# Patient Record
Sex: Male | Born: 1985 | Race: White | Hispanic: Yes | Marital: Single | Smoking: Never smoker
Health system: Southern US, Community
[De-identification: ages and names within clinical notes are randomized; demographics above are authoritative.]

## PROBLEM LIST (undated history)

## (undated) DIAGNOSIS — E079 Disorder of thyroid, unspecified: Secondary | ICD-10-CM

## (undated) DIAGNOSIS — I10 Essential (primary) hypertension: Secondary | ICD-10-CM

## (undated) HISTORY — DX: Essential (primary) hypertension: I10

## (undated) HISTORY — DX: Disorder of thyroid, unspecified: E07.9

## (undated) HISTORY — PX: SHOULDER SURGERY: SHX246

---

## 2009-09-24 ENCOUNTER — Emergency Department (HOSPITAL_COMMUNITY): Admission: EM | Admit: 2009-09-24 | Discharge: 2009-09-24 | Payer: Self-pay | Admitting: Emergency Medicine

## 2010-04-11 ENCOUNTER — Emergency Department (HOSPITAL_COMMUNITY)
Admission: EM | Admit: 2010-04-11 | Discharge: 2010-04-11 | Disposition: A | Discharge status: Home / Self Care | Payer: Self-pay | Attending: Emergency Medicine | Admitting: Emergency Medicine

## 2010-04-11 ENCOUNTER — Emergency Department (HOSPITAL_COMMUNITY): Payer: Self-pay

## 2010-04-11 DIAGNOSIS — W268XXA Contact with other sharp object(s), not elsewhere classified, initial encounter: Secondary | ICD-10-CM | POA: Insufficient documentation

## 2010-04-11 DIAGNOSIS — S91309A Unspecified open wound, unspecified foot, initial encounter: Secondary | ICD-10-CM | POA: Insufficient documentation

## 2010-04-11 DIAGNOSIS — M79609 Pain in unspecified limb: Secondary | ICD-10-CM | POA: Insufficient documentation

## 2010-04-11 DIAGNOSIS — Z1881 Retained glass fragments: Secondary | ICD-10-CM | POA: Insufficient documentation

## 2010-05-24 ENCOUNTER — Emergency Department (HOSPITAL_COMMUNITY): Payer: Self-pay

## 2010-05-24 ENCOUNTER — Emergency Department (HOSPITAL_COMMUNITY)
Admission: EM | Admit: 2010-05-24 | Discharge: 2010-05-24 | Disposition: A | Discharge status: Home / Self Care | Payer: Self-pay | Attending: Emergency Medicine | Admitting: Emergency Medicine

## 2010-05-24 DIAGNOSIS — M25529 Pain in unspecified elbow: Secondary | ICD-10-CM | POA: Insufficient documentation

## 2010-05-24 DIAGNOSIS — M545 Low back pain, unspecified: Secondary | ICD-10-CM | POA: Insufficient documentation

## 2010-05-24 DIAGNOSIS — M79609 Pain in unspecified limb: Secondary | ICD-10-CM | POA: Insufficient documentation

## 2010-05-24 DIAGNOSIS — S7000XA Contusion of unspecified hip, initial encounter: Secondary | ICD-10-CM | POA: Insufficient documentation

## 2010-05-24 DIAGNOSIS — W138XXA Fall from, out of or through other building or structure, initial encounter: Secondary | ICD-10-CM | POA: Insufficient documentation

## 2010-05-24 DIAGNOSIS — R109 Unspecified abdominal pain: Secondary | ICD-10-CM | POA: Insufficient documentation

## 2010-05-24 DIAGNOSIS — S62109A Fracture of unspecified carpal bone, unspecified wrist, initial encounter for closed fracture: Secondary | ICD-10-CM | POA: Insufficient documentation

## 2010-05-24 DIAGNOSIS — M25559 Pain in unspecified hip: Secondary | ICD-10-CM | POA: Insufficient documentation

## 2010-05-24 LAB — CBC
HCT: 47.8 % (ref 39.0–52.0)
Hemoglobin: 16.8 g/dL (ref 13.0–17.0)
MCH: 30.9 pg (ref 26.0–34.0)
MCHC: 35.1 g/dL (ref 30.0–36.0)
MCV: 87.9 fL (ref 78.0–100.0)
Platelets: 318 K/uL (ref 150–400)
RBC: 5.44 MIL/uL (ref 4.22–5.81)
RDW: 12.5 % (ref 11.5–15.5)
WBC: 7.8 10*3/uL (ref 4.0–10.5)

## 2010-05-24 LAB — BASIC METABOLIC PANEL WITH GFR
CO2: 25 meq/L (ref 19–32)
Calcium: 9.8 mg/dL (ref 8.4–10.5)
Creatinine, Ser: 0.98 mg/dL (ref 0.4–1.5)
GFR calc Af Amer: >60 mL/min (ref >60)
Sodium: 145 meq/L (ref 135–145)

## 2010-05-24 LAB — DIFFERENTIAL
Basophils Absolute: 0 K/uL (ref 0.0–0.1)
Basophils Relative: 0 % (ref 0–1)
Eosinophils Absolute: 0.1 K/uL (ref 0.0–0.7)
Eosinophils Relative: 1 % (ref 0–5)
Lymphocytes Relative: 20 % (ref 12–46)
Lymphs Abs: 1.6 10*3/uL (ref 0.7–4.0)
Monocytes Absolute: 0.5 10*3/uL (ref 0.1–1.0)
Monocytes Relative: 7 % (ref 3–12)
Neutro Abs: 5.6 K/uL (ref 1.7–7.7)
Neutrophils Relative %: 72 % (ref 43–77)

## 2010-05-24 LAB — BASIC METABOLIC PANEL
BUN: 21 mg/dL (ref 6–23)
Chloride: 110 mEq/L (ref 96–112)
GFR calc non Af Amer: >60 mL/min (ref >60)
Glucose, Bld: 115 mg/dL — ABNORMAL HIGH (ref 70–99)
Potassium: 4 mEq/L (ref 3.5–5.1)

## 2010-05-24 LAB — URINALYSIS, ROUTINE W REFLEX MICROSCOPIC
Bilirubin Urine: NEGATIVE
Glucose, UA: NEGATIVE mg/dL
Hgb urine dipstick: NEGATIVE
Ketones, ur: NEGATIVE mg/dL
Nitrite: NEGATIVE
Protein, ur: NEGATIVE mg/dL
Specific Gravity, Urine: 1.039 — ABNORMAL HIGH (ref 1.005–1.030)
Urobilinogen, UA: 0.2 mg/dL (ref 0.0–1.0)
pH: 5.5 (ref 5.0–8.0)

## 2010-05-24 LAB — SAMPLE TO BLOOD BANK

## 2010-05-24 MED ORDER — IOHEXOL 300 MG/ML  SOLN
100.0000 mL | Freq: Once | INTRAMUSCULAR | Status: AC | PRN
  Administered 2010-05-24: 100 mL via INTRAVENOUS

## 2010-12-02 ENCOUNTER — Emergency Department (HOSPITAL_COMMUNITY)
Admission: EM | Admit: 2010-12-02 | Discharge: 2010-12-02 | Disposition: A | Discharge status: Home / Self Care | Payer: Self-pay | Attending: Emergency Medicine | Admitting: Emergency Medicine

## 2010-12-02 ENCOUNTER — Emergency Department (HOSPITAL_COMMUNITY): Payer: Self-pay

## 2010-12-02 DIAGNOSIS — B2790 Infectious mononucleosis, unspecified without complication: Secondary | ICD-10-CM | POA: Insufficient documentation

## 2010-12-02 DIAGNOSIS — R070 Pain in throat: Secondary | ICD-10-CM | POA: Insufficient documentation

## 2010-12-02 DIAGNOSIS — R1310 Dysphagia, unspecified: Secondary | ICD-10-CM | POA: Insufficient documentation

## 2010-12-02 DIAGNOSIS — R599 Enlarged lymph nodes, unspecified: Secondary | ICD-10-CM | POA: Insufficient documentation

## 2010-12-02 LAB — CBC
HCT: 44 % (ref 39.0–52.0)
Hemoglobin: 16 g/dL (ref 13.0–17.0)
MCV: 87.5 fL (ref 78.0–100.0)
RBC: 5.03 MIL/uL (ref 4.22–5.81)
WBC: 6 10*3/uL (ref 4.0–10.5)

## 2010-12-02 MED ORDER — IOHEXOL 300 MG/ML  SOLN: 75.0000 mL | Freq: Once | INTRAMUSCULAR | Status: DC | PRN

## 2015-12-15 ENCOUNTER — Emergency Department (HOSPITAL_COMMUNITY)
Admission: EM | Admit: 2015-12-15 | Discharge: 2015-12-15 | Disposition: A | Discharge status: Home / Self Care | Payer: Self-pay | Attending: Emergency Medicine | Admitting: Emergency Medicine

## 2015-12-15 ENCOUNTER — Encounter (HOSPITAL_COMMUNITY): Payer: Self-pay

## 2015-12-15 ENCOUNTER — Emergency Department (HOSPITAL_COMMUNITY): Payer: Self-pay

## 2015-12-15 DIAGNOSIS — Y929 Unspecified place or not applicable: Secondary | ICD-10-CM | POA: Insufficient documentation

## 2015-12-15 DIAGNOSIS — S20221A Contusion of right back wall of thorax, initial encounter: Secondary | ICD-10-CM

## 2015-12-15 DIAGNOSIS — S300XXA Contusion of lower back and pelvis, initial encounter: Secondary | ICD-10-CM | POA: Insufficient documentation

## 2015-12-15 DIAGNOSIS — Y939 Activity, unspecified: Secondary | ICD-10-CM | POA: Insufficient documentation

## 2015-12-15 DIAGNOSIS — W501XXA Accidental kick by another person, initial encounter: Secondary | ICD-10-CM | POA: Insufficient documentation

## 2015-12-15 DIAGNOSIS — Y999 Unspecified external cause status: Secondary | ICD-10-CM | POA: Insufficient documentation

## 2015-12-15 MED ORDER — LIDOCAINE 5 % EX PTCH: 1.0000 | MEDICATED_PATCH | CUTANEOUS | 0 refills | Status: DC

## 2015-12-15 MED ORDER — MELOXICAM 7.5 MG PO TABS: 7.5000 mg | ORAL_TABLET | Freq: Every day | ORAL | 0 refills | Status: DC

## 2015-12-15 MED ORDER — CYCLOBENZAPRINE HCL 10 MG PO TABS: 10.0000 mg | ORAL_TABLET | Freq: Three times a day (TID) | ORAL | 0 refills | Status: DC | PRN
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
## 2015-12-15 NOTE — ED Notes (Signed)
Bed: WTR8 Expected date:  Expected time:  Means of arrival:  Comments: 

## 2015-12-15 NOTE — ED Notes (Signed)
Pt offered wheelchair to lobby until room available pt denies wheelchair.
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
## 2015-12-15 NOTE — Discharge Instructions (Signed)
Read the information below.  Use the prescribed medication as directed.  Please discuss all new medications with your pharmacist.  You may return to the Emergency Department at any time for worsening condition or any new symptoms that concern you.     If you develop fevers, loss of control of bowel or bladder, weakness or numbness in your legs, or are unable to walk, return to the ER for a recheck.   Lea la informacin a continuacin. Use el medicamento recetado como se le indic. Por favor, discuta todos los medicamentos nuevos con su farmacutico. Puede volver al MicrosoftDepartamento de Emergencias en cualquier momento por empeoramiento de la condicin o cualquier sntoma nuevo que le concierna. Si desarrolla fiebres, prdida de control de los intestinos o la vejiga, debilidad o entumecimiento en las piernas, o no puede caminar, regrese a la sala de emergencias para una nueva verificacin.

## 2016-09-03 ENCOUNTER — Ambulatory Visit (INDEPENDENT_AMBULATORY_CARE_PROVIDER_SITE_OTHER): Payer: Self-pay

## 2016-09-03 ENCOUNTER — Encounter: Payer: Self-pay | Admitting: Urgent Care

## 2016-09-03 ENCOUNTER — Ambulatory Visit (INDEPENDENT_AMBULATORY_CARE_PROVIDER_SITE_OTHER): Payer: Self-pay | Admitting: Urgent Care

## 2016-09-03 VITALS — BP 122/72 | HR 73 | Temp 98.0°F | Resp 17 | Ht 66.5 in | Wt 205.0 lb

## 2016-09-03 DIAGNOSIS — M25561 Pain in right knee: Secondary | ICD-10-CM

## 2016-09-03 MED ORDER — MELOXICAM 15 MG PO TABS: 7.5000 mg | ORAL_TABLET | Freq: Every day | ORAL | 0 refills | Status: DC

## 2016-09-03 NOTE — Patient Instructions (Addendum)
Dolor de rodilla (Knee Pain) El dolor de rodilla es un sntoma muy comn y puede tener muchas causas. Suele desaparecer cuando se siguen las indicaciones del mdico en lo que respecta a Engineer, materialsaliviar el dolor y las molestias en la casa. Sin embargo, puede Scientist, product/process developmentprogresar hasta convertirse en una afeccin que requiere Obiontratamiento. Algunas afecciones pueden incluir lo siguiente:  Artritis por uso y Chiropractordesgaste (artrosis).  Artritis por hinchazn e irritacin (artritis reumatoide o gota).  Un quiste o un crecimiento en la rodilla.  Una infeccin en la articulacin de la rodilla.  Una lesin que no se Arubacura.  Dao, hinchazn o irritacin de los tejidos que sostienen la rodilla (distensin de ligamentos o tendinitis). Si el dolor de Paramedicrodilla persiste, tal vez haya que realizar ms estudios para Scientist, forensicdiagnosticar la afeccin, los cuales pueden incluir radiografas u otros estudios de diagnstico por imgenes de la rodilla. Adems, es posible que haya que extraerle lquido de la rodilla. El tratamiento del dolor continuo de rodilla depende de la causa, pero puede incluir lo siguiente:  Medicamentos para Engineer, materialsaliviar el dolor o reducir la hinchazn.  Inyecciones de corticoides en la rodilla.  Fisioterapia.  Ciruga. INSTRUCCIONES PARA EL CUIDADO EN EL HOGAR  Tome los medicamentos solamente como se lo haya indicado el mdico.  Mantenga la rodilla en reposo y en alto (elevada) mientras est descansando.  No haga cosas que le causen dolor o que lo intensifiquen.  Evite las actividades o los ejercicios de alto impacto, como correr, Public relations account executivesaltar la soga o hacer saltos de tijera.  Aplique hielo sobre la zona de la rodilla: ? Ponga el hielo en una bolsa plstica. ? Coloque una FirstEnergy Corptoalla entre la piel y la bolsa de hielo. ? Coloque el hielo durante 20 minutos, 2 a 3 veces por da.  Pregntele al mdico si debe usar una Neurosurgeonrodillera elstica.  Cuando duerma, pngase una almohada debajo de la rodilla.  Baje de peso si es  necesario. El Union Parkpeso extra puede generar presin en la rodilla.  No consuma ningn producto que contenga tabaco, lo que incluye cigarrillos, tabaco de Theatre managermascar o Administrator, Civil Servicecigarrillos electrnicos. Si necesita ayuda para dejar de fumar, consulte al mdico. Fumar puede retrasar la curacin de cualquier problema que tenga en el hueso y Nurse, learning disabilityla articulacin. SOLICITE ATENCIN MDICA SI:  El dolor de rodilla contina, [place] Pol[place] o empeora.  Tiene fiebre junto con dolor de rodilla.  La rodilla se le tuerce o se le traba.  La rodilla est ms hinchada. SOLICITE ATENCIN MDICA DE INMEDIATO SI:   La articulacin de la rodilla est caliente al tacto.  Tiene dolor en el pecho o dificultad para respirar. Esta informacin no tiene Theme park managercomo fin reemplazar el consejo del mdico. Asegrese de hacerle al mdico cualquier pregunta que tenga. Document Released: 07/31/2007 Document Revised: 03/04/2014 Elsevier Interactive Patient Education  2017 [employer]vier [employer].     IF you received an x-ray today, you will receive an invoice from [facility]. Please contact [facility] at [phone] with questions or concerns regarding your invoice.   IF you received labwork today, you will receive an invoice from [facility]. Please contact [facility] at [phone] with questions or concerns regarding your invoice.   Our billing staff will not be able to assist you with questions regarding bills from these companies.  You will be contacted with the lab results as soon as they are available. The fastest way to get your results is to activate your My Chart account. Instructions are located on the last page of this paperwork. If  you have not heard from [place] regarding the results in 2 weeks, please contact this office.

## 2016-09-03 NOTE — Progress Notes (Addendum)
  MRN: [id number] DOB: Apr 06, 1985  Subjective:   Victor Gonzalez is a 31 y.o. male presenting for chief complaint of Knee Pain (right )  Reports 4 day history of worsening right knee pain. Pain is sharp in nature, worse with pressure and bending his knee, has associated swelling. Patient works in Quarry managerroofing, uses Biomedical engineerknee pads. Denies fever, redness, warmth, knee buckling, weakness, trauma, falls. Uses otc supplement for joints.   Victor Gonzalez is not currently taking any medications. Also has No Known Allergies. Victor Gonzalez denies past medical and surgical history.   Objective:   Vitals: BP 122/72   Pulse 73   Temp 98 F (36.7 C) (Oral)   Resp 17   Ht 5' 6.5" (1.689 m)   Wt 205 lb (93 kg)   SpO2 98%   BMI 32.59 kg/m   Physical Exam  Constitutional: He is oriented to person, place, and time. He appears well-developed and well-nourished.  Cardiovascular: Normal rate.   Pulmonary/Chest: Effort normal.  Musculoskeletal:       Right knee: He exhibits decreased range of motion (full flexion), swelling (trace) and bony tenderness (patella). He exhibits no effusion, no ecchymosis, no deformity, no laceration, no erythema, normal alignment and normal patellar mobility. Tenderness (over pre-patellaer and infrapatellar areas) found. No medial joint line, no lateral joint line, no MCL, no LCL and no patellar tendon tenderness noted.  Neurological: He is alert and oriented to person, place, and time. He displays normal reflexes.   Dg Knee Complete 4 Views Right  Result Date: 09/03/2016 CLINICAL DATA:  right knee pain EXAM: RIGHT KNEE - COMPLETE 4+ VIEW COMPARISON:  None. FINDINGS: No fracture of the proximal tibia or distal femur. Patella is normal. No joint effusion. IMPRESSION: No fracture or dislocation. Electronically Signed   By: Genevive BiStewart  Edmunds M.D.   On: 09/03/2016 12:23   Assessment and Plan :   1. Acute pain of right knee - Patient likely has pain due to nature of his work,  inflammatory process due to overuse. Start meloxicam, recommended rest. Work restrictions offered but patient declined, will rest from work on his own for 1 week. F/u in 1 week if no improvement.  Wallis BambergMario Takako Minckler, PA-C [facility] [phone] 09/03/2016  11:33 AM

## 2017-01-09 ENCOUNTER — Encounter: Payer: Self-pay | Admitting: Family Medicine

## 2017-01-09 ENCOUNTER — Ambulatory Visit: Payer: Self-pay | Admitting: Family Medicine

## 2017-01-09 ENCOUNTER — Other Ambulatory Visit: Payer: Self-pay

## 2017-01-09 VITALS — BP 98/60 | HR 71 | Temp 97.7°F | Resp 18 | Ht 66.5 in | Wt 204.0 lb

## 2017-01-09 DIAGNOSIS — H00015 Hordeolum externum left lower eyelid: Secondary | ICD-10-CM

## 2017-01-09 MED ORDER — ERYTHROMYCIN 5 MG/GM OP OINT: 1.0000 "application " | TOPICAL_OINTMENT | Freq: Three times a day (TID) | OPHTHALMIC | 0 refills | Status: DC

## 2017-01-09 NOTE — Patient Instructions (Addendum)
1. Dr Ernesto Rutherfordobert Groat Address: [address], [place], [place] [address]  Phone: [phone]     IF you received an x-ray today, you will receive an invoice from [facility]. Please contact [facility] at [phone] with questions or concerns regarding your invoice.   IF you received labwork today, you will receive an invoice from [facility]. Please contact [facility] at [phone] with questions or concerns regarding your invoice.   Our billing staff will not be able to assist you with questions regarding bills from these companies.  You will be contacted with the lab results as soon as they are available. The fastest way to get your results is to activate your My Chart account. Instructions are located on the last page of this paperwork. If you have not heard from us regarding the results in 2 weeks, please contact this office.    Orzuelo (Stye) Un orzuelo es un bulto en el prpado causado por una infeccin bacteriana. Puede formarse dentro del prpado (orzuelo interno) o fuera del prpado (orzuelo externo). Un orzuelo interno puede ser causado por una infeccin en una glndula sebcea dentro del prpado. Un orzuelo externo puede estar causado por una infeccin en la base de la pestaa (folculo piloso). Los orzuelos son muy frecuentes. Todas las personas pueden tener orzuelos a Actuarycualquier edad. Suelen ocurrir solo en un ojo, Biomedical engineerpero puede tener ms de Inteluno en los dos ojos. CAUSAS La infeccin casi siempre es causada por una bacteria llamada Staphylococcus aureus, que es un tipo comn de bacteria que vive en la piel. FACTORES DE RIESGO Puede tener un riesgo ms alto de sufrir un orzuelo si ya ha tenido [place]uno. Tambin puede tener un riesgo ms alto si tiene:  Diabetes.  Una enfermedad crnica.  Enrojecimiento prolongado en los ojos.  Una afeccin cutnea denominada seborrea.  Niveles altos de grasa en la sangre (lpidos). SIGNOS Y SNTOMAS El dolor en el prpado es el  sntoma ms frecuente del [place] Cityorzuelo. Los orzuelos internos son ms dolorosos que los externos. Otros signos y sntomas pueden incluir los siguientes:  Hinchazn dolorosa del prpado.  Sensacin de [employer]bury Automotive Grouppicazn en el ojo.  Lagrimeo y enrojecimiento del ojo.  Pus que drena del orzuelo. DIAGNSTICO Con tan solo examinarle el ojo, el mdico puede diagnosticarle un Idaliaorzuelo. Tambin puede revisarlo para asegurarse de que:  No tenga fiebre ni otros signos de una infeccin ms grave.  La infeccin no se haya diseminado a otras partes del ojo o a zonas circundantes. TRATAMIENTO La mayora de los orzuelos desparecen en unos das sin Gilmantratamiento. En algunos casos, puede necesitar antibiticos en gotas o ungento para prevenir la infeccin. Es posible que el mdico deba drenar el orzuelo por va quirrgica si este:  Es grande.  Causa mucho dolor.  Interfiere con la visin. Esto se puede realizar con un instrumento cortante de hoja delgada o una aguja. INSTRUCCIONES PARA EL CUIDADO EN EL HOGAR  Tome los medicamentos solamente como se lo haya indicado el mdico.  Aplique una compresa limpia y caliente sobre ojo durante 10minutos, 4veces al Futures traderda.  No use lentes de contacto ni maquillaje para los ojos [place] que el orzuelo se haya curado.  No trate de reventar o drenar el orzuelo. SOLICITE ATENCIN MDICA SI:  Tiene escalofros o fiebre.  El orzuelo no desaparece despus de [address]varios das.  El orzuelo afecta la visin.  Comienza a Psychiatristsentir dolor en el globo ocular, o se le hincha o enrojece. ASEGRESE DE QUE:  Comprende estas instrucciones.  Controlar su afeccin.  Recibir ayuda de inmediato si no mejora o si empeora. Esta informacin no tiene Theme park managercomo fin reemplazar el consejo del mdico. Asegrese de hacerle al mdico cualquier pregunta que tenga. Document Released: 11/21/2004 Document Revised: 03/04/2014 Document Reviewed: 05/28/2013 Elsevier Interactive Patient Education  [employer]2018 Elsevier  Inc.

## 2017-01-10 NOTE — Progress Notes (Signed)
   11/16/201811:15 AM  Victor Gonzalez 01-10-1986, 31 y.o. male [id number]  Chief Complaint  Patient presents with  . Stye    right eye x8days     HPI:   Patient is a 31 y.o. male who presents today for concerns of redness and swelling of right lower eyelid for the past 8 days. Patient states that he notices a small pimple that is painful. 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 He denies any changes in vision. He has not done anything for this.  No flowsheet data found.  No Known Allergies  Prior to Admission medications   Medication Sig Start Date End Date Taking? Authorizing Provider    No past medical history on file.  No past surgical history on file.  Social History   Tobacco Use  . Smoking status: Never Smoker  . Smokeless tobacco: Never Used  Substance Use Topics  . Alcohol use: No    No family history on file.  ROS Per HPI  OBJECTIVE:  Blood pressure 98/60, pulse 71, temperature 97.7 F (36.5 C), temperature source Oral, resp. rate 18, height 5' 6.5" (1.689 m), weight 204 lb (92.5 kg), SpO2 99 %.  Physical Exam  Constitutional: He is well-developed, well-nourished, and in no distress.  HENT:  Head: Normocephalic and atraumatic.  Eyes: Conjunctivae and EOM are normal. Pupils are equal, round, and reactive to light. Right eye exhibits hordeolum. Right eye exhibits no chemosis and no discharge. No foreign body present in the right eye.       ASSESSMENT and PLAN  1. Hordeolum externum of left lower eyelid Discussed supportive measures, new meds r/se/b and RTC precautions. Patient educational handout given.  Other orders - erythromycin ophthalmic ointment; Place 1 application 3 (three) times daily into the left eye.  Return if symptoms worsen or fail to improve.    Myles LippsIrma M Santiago, MD [facility] at [facility] [address] [place], [place] [address] Ph.   [phone] Fax [phone]

## 2019-11-18 ENCOUNTER — Emergency Department (HOSPITAL_COMMUNITY): Payer: HRSA Program

## 2019-11-18 ENCOUNTER — Other Ambulatory Visit: Payer: Self-pay

## 2019-11-18 ENCOUNTER — Encounter (HOSPITAL_COMMUNITY): Payer: Self-pay | Admitting: Emergency Medicine

## 2019-11-18 ENCOUNTER — Inpatient Hospital Stay (HOSPITAL_COMMUNITY)
Admission: EM | Admit: 2019-11-18 | Discharge: 2019-11-25 | DRG: 177 | Disposition: A | Discharge status: Home / Self Care | Payer: HRSA Program | Attending: Internal Medicine | Admitting: Internal Medicine

## 2019-11-18 DIAGNOSIS — J9601 Acute respiratory failure with hypoxia: Secondary | ICD-10-CM | POA: Diagnosis present

## 2019-11-18 DIAGNOSIS — U071 COVID-19: Secondary | ICD-10-CM | POA: Diagnosis not present

## 2019-11-18 DIAGNOSIS — J1282 Pneumonia due to coronavirus disease 2019: Secondary | ICD-10-CM | POA: Diagnosis present

## 2019-11-18 DIAGNOSIS — R739 Hyperglycemia, unspecified: Secondary | ICD-10-CM | POA: Diagnosis present

## 2019-11-18 DIAGNOSIS — E876 Hypokalemia: Secondary | ICD-10-CM | POA: Diagnosis present

## 2019-11-18 DIAGNOSIS — R7989 Other specified abnormal findings of blood chemistry: Secondary | ICD-10-CM | POA: Diagnosis present

## 2019-11-18 DIAGNOSIS — T380X5A Adverse effect of glucocorticoids and synthetic analogues, initial encounter: Secondary | ICD-10-CM | POA: Diagnosis present

## 2019-11-18 DIAGNOSIS — R7401 Elevation of levels of liver transaminase levels: Secondary | ICD-10-CM | POA: Diagnosis present

## 2019-11-18 LAB — CBC
HCT: 47.9 % (ref 39.0–52.0)
Hemoglobin: 16.4 g/dL (ref 13.0–17.0)
MCH: 31.1 pg (ref 26.0–34.0)
MCHC: 34.2 g/dL (ref 30.0–36.0)
MCV: 90.9 fL (ref 80.0–100.0)
Platelets: 297 10*3/uL (ref 150–400)
RBC: 5.27 MIL/uL (ref 4.22–5.81)
RDW: 12.3 % (ref 11.5–15.5)
WBC: 11.7 10*3/uL — ABNORMAL HIGH (ref 4.0–10.5)
nRBC: 0 % (ref 0.0–0.2)

## 2019-11-18 LAB — COMPREHENSIVE METABOLIC PANEL
ALT: 56 U/L — ABNORMAL HIGH (ref 0–44)
AST: 46 U/L — ABNORMAL HIGH (ref 15–41)
Albumin: 3.6 g/dL (ref 3.5–5.0)
Alkaline Phosphatase: 89 U/L (ref 38–126)
Anion gap: 14 (ref 5–15)
BUN: 13 mg/dL (ref 6–20)
CO2: 22 mmol/L (ref 22–32)
Calcium: 8.5 mg/dL — ABNORMAL LOW (ref 8.9–10.3)
Chloride: 103 mmol/L (ref 98–111)
Creatinine, Ser: 0.75 mg/dL (ref 0.61–1.24)
GFR calc Af Amer: >60 mL/min (ref >60)
GFR calc non Af Amer: >60 mL/min (ref >60)
Glucose, Bld: 131 mg/dL — ABNORMAL HIGH (ref 70–99)
Potassium: 3.3 mmol/L — ABNORMAL LOW (ref 3.5–5.1)
Sodium: 139 mmol/L (ref 135–145)
Total Bilirubin: 0.9 mg/dL (ref 0.3–1.2)
Total Protein: 7.4 g/dL (ref 6.5–8.1)

## 2019-11-18 LAB — RESPIRATORY PANEL BY RT PCR (FLU A&B, COVID)
Influenza A by PCR: NEGATIVE
Influenza B by PCR: NEGATIVE
SARS Coronavirus 2 by RT PCR: POSITIVE — AB

## 2019-11-18 NOTE — ED Triage Notes (Signed)
With the use of a translator: pt has c/o COVID symptoms such as cough, sore throat, diarrhea, headache and fatigue.  Pt O2 level was low in Triage, placed on 2 L [place], returned WNL.  Symptoms stared 8 days ago.

## 2019-11-19 ENCOUNTER — Encounter (HOSPITAL_COMMUNITY): Payer: Self-pay | Admitting: Family Medicine

## 2019-11-19 ENCOUNTER — Emergency Department (HOSPITAL_COMMUNITY): Payer: HRSA Program

## 2019-11-19 ENCOUNTER — Other Ambulatory Visit: Payer: Self-pay

## 2019-11-19 DIAGNOSIS — R7401 Elevation of levels of liver transaminase levels: Secondary | ICD-10-CM | POA: Diagnosis present

## 2019-11-19 DIAGNOSIS — E876 Hypokalemia: Secondary | ICD-10-CM | POA: Diagnosis present

## 2019-11-19 DIAGNOSIS — J1282 Pneumonia due to coronavirus disease 2019: Secondary | ICD-10-CM

## 2019-11-19 DIAGNOSIS — J9601 Acute respiratory failure with hypoxia: Secondary | ICD-10-CM | POA: Diagnosis present

## 2019-11-19 DIAGNOSIS — T380X5A Adverse effect of glucocorticoids and synthetic analogues, initial encounter: Secondary | ICD-10-CM | POA: Diagnosis present

## 2019-11-19 DIAGNOSIS — U071 COVID-19: Secondary | ICD-10-CM | POA: Diagnosis present

## 2019-11-19 DIAGNOSIS — R7989 Other specified abnormal findings of blood chemistry: Secondary | ICD-10-CM | POA: Diagnosis present

## 2019-11-19 DIAGNOSIS — R739 Hyperglycemia, unspecified: Secondary | ICD-10-CM | POA: Diagnosis present

## 2019-11-19 LAB — CBC WITH DIFFERENTIAL/PLATELET
Abs Immature Granulocytes: 0.05 10*3/uL (ref 0.00–0.07)
Basophils Absolute: 0 10*3/uL (ref 0.0–0.1)
Basophils Relative: 0 %
Eosinophils Absolute: 0 10*3/uL (ref 0.0–0.5)
Eosinophils Relative: 0 %
HCT: 46.4 % (ref 39.0–52.0)
Hemoglobin: 15.8 g/dL (ref 13.0–17.0)
Immature Granulocytes: 1 %
Lymphocytes Relative: 8 %
Lymphs Abs: 0.8 10*3/uL (ref 0.7–4.0)
MCH: 31.4 pg (ref 26.0–34.0)
MCHC: 34.1 g/dL (ref 30.0–36.0)
MCV: 92.2 fL (ref 80.0–100.0)
Monocytes Absolute: 0.3 10*3/uL (ref 0.1–1.0)
Monocytes Relative: 3 %
Neutro Abs: 8.5 10*3/uL — ABNORMAL HIGH (ref 1.7–7.7)
Neutrophils Relative %: 88 %
Platelets: 290 10*3/uL (ref 150–400)
RBC: 5.03 MIL/uL (ref 4.22–5.81)
RDW: 12.4 % (ref 11.5–15.5)
WBC: 9.6 10*3/uL (ref 4.0–10.5)
nRBC: 0 % (ref 0.0–0.2)

## 2019-11-19 LAB — FERRITIN: Ferritin: 386 ng/mL — ABNORMAL HIGH (ref 24–336)

## 2019-11-19 LAB — COMPREHENSIVE METABOLIC PANEL
ALT: 47 U/L — ABNORMAL HIGH (ref 0–44)
AST: 40 U/L (ref 15–41)
Albumin: 3.4 g/dL — ABNORMAL LOW (ref 3.5–5.0)
Alkaline Phosphatase: 74 U/L (ref 38–126)
Anion gap: 13 (ref 5–15)
BUN: 13 mg/dL (ref 6–20)
CO2: 26 mmol/L (ref 22–32)
Calcium: 8.6 mg/dL — ABNORMAL LOW (ref 8.9–10.3)
Chloride: 100 mmol/L (ref 98–111)
Creatinine, Ser: 0.8 mg/dL (ref 0.61–1.24)
GFR calc Af Amer: >60 mL/min (ref >60)
GFR calc non Af Amer: >60 mL/min (ref >60)
Glucose, Bld: 164 mg/dL — ABNORMAL HIGH (ref 70–99)
Potassium: 4.1 mmol/L (ref 3.5–5.1)
Sodium: 139 mmol/L (ref 135–145)
Total Bilirubin: 1.1 mg/dL (ref 0.3–1.2)
Total Protein: 7.1 g/dL (ref 6.5–8.1)

## 2019-11-19 LAB — HIV ANTIBODY (ROUTINE TESTING W REFLEX): HIV Screen 4th Generation wRfx: NONREACTIVE

## 2019-11-19 LAB — CBG MONITORING, ED
Glucose-Capillary: 172 mg/dL — ABNORMAL HIGH (ref 70–99)
Glucose-Capillary: 193 mg/dL — ABNORMAL HIGH (ref 70–99)
Glucose-Capillary: 203 mg/dL — ABNORMAL HIGH (ref 70–99)

## 2019-11-19 LAB — C-REACTIVE PROTEIN: CRP: 9.2 mg/dL — ABNORMAL HIGH (ref <1.0)

## 2019-11-19 LAB — HEMOGLOBIN A1C
Hgb A1c MFr Bld: 5.8 % — ABNORMAL HIGH (ref 4.8–5.6)
Mean Plasma Glucose: 119.76 mg/dL

## 2019-11-19 LAB — GLUCOSE, CAPILLARY
Glucose-Capillary: 264 mg/dL — ABNORMAL HIGH (ref 70–99)
Glucose-Capillary: 225 mg/dL — ABNORMAL HIGH (ref 70–99)

## 2019-11-19 LAB — PROCALCITONIN: Procalcitonin: 0.11 ng/mL

## 2019-11-19 LAB — D-DIMER, QUANTITATIVE: D-Dimer, Quant: 0.44 ug/mL-FEU (ref 0.00–0.50)

## 2019-11-19 LAB — PHOSPHORUS: Phosphorus: 3.6 mg/dL (ref 2.5–4.6)

## 2019-11-19 LAB — MRSA PCR SCREENING: MRSA by PCR: NEGATIVE

## 2019-11-19 LAB — MAGNESIUM: Magnesium: 2.1 mg/dL (ref 1.7–2.4)

## 2019-11-19 MED ORDER — SODIUM CHLORIDE 0.9 % IV SOLN
200.0000 mg | Freq: Once | INTRAVENOUS | Status: DC
  Filled 2019-11-19: qty 40

## 2019-11-19 MED ORDER — DEXAMETHASONE 4 MG PO TABS
6.0000 mg | ORAL_TABLET | Freq: Once | ORAL | Status: DC
  Filled 2019-11-19: qty 2

## 2019-11-19 MED ORDER — SODIUM CHLORIDE 0.9 % IV SOLN
200.0000 mg | Freq: Once | INTRAVENOUS | Status: AC
  Administered 2019-11-19: 200 mg via INTRAVENOUS
  Filled 2019-11-19: qty 40

## 2019-11-19 MED ORDER — THIAMINE HCL 100 MG PO TABS
100.0000 mg | ORAL_TABLET | Freq: Every day | ORAL | Status: DC
  Administered 2019-11-19 – 2019-11-25 (×7): 100 mg via ORAL
  Filled 2019-11-19 (×8): qty 1

## 2019-11-19 MED ORDER — ALBUTEROL SULFATE HFA 108 (90 BASE) MCG/ACT IN AERS: 2.0000 | INHALATION_SPRAY | Freq: Four times a day (QID) | RESPIRATORY_TRACT | Status: DC

## 2019-11-19 MED ORDER — AZITHROMYCIN 500 MG PO TABS
250.0000 mg | ORAL_TABLET | Freq: Every day | ORAL | Status: DC
  Administered 2019-11-20: 250 mg via ORAL
  Filled 2019-11-19: qty 1

## 2019-11-19 MED ORDER — ENOXAPARIN SODIUM 40 MG/0.4ML ~~LOC~~ SOLN
40.0000 mg | SUBCUTANEOUS | Status: DC
  Administered 2019-11-19 – 2019-11-25 (×7): 40 mg via SUBCUTANEOUS
  Filled 2019-11-19 (×7): qty 0.4

## 2019-11-19 MED ORDER — ZINC SULFATE 220 (50 ZN) MG PO CAPS
220.0000 mg | ORAL_CAPSULE | Freq: Every day | ORAL | Status: DC
  Administered 2019-11-19 – 2019-11-25 (×7): 220 mg via ORAL
  Filled 2019-11-19 (×7): qty 1

## 2019-11-19 MED ORDER — PREDNISONE 20 MG PO TABS: 50.0000 mg | ORAL_TABLET | Freq: Every day | ORAL | Status: DC

## 2019-11-19 MED ORDER — GUAIFENESIN-DM 100-10 MG/5ML PO SYRP
10.0000 mL | ORAL_SOLUTION | ORAL | Status: DC | PRN
  Administered 2019-11-19 – 2019-11-24 (×8): 10 mL via ORAL
  Filled 2019-11-19 (×8): qty 10

## 2019-11-19 MED ORDER — IPRATROPIUM BROMIDE HFA 17 MCG/ACT IN AERS
2.0000 | INHALATION_SPRAY | Freq: Three times a day (TID) | RESPIRATORY_TRACT | Status: DC
  Administered 2019-11-19 – 2019-11-25 (×19): 2 via RESPIRATORY_TRACT
  Filled 2019-11-19 (×3): qty 12.9

## 2019-11-19 MED ORDER — ASCORBIC ACID 500 MG PO TABS
500.0000 mg | ORAL_TABLET | Freq: Every day | ORAL | Status: DC
  Administered 2019-11-19 – 2019-11-25 (×7): 500 mg via ORAL
  Filled 2019-11-19 (×7): qty 1

## 2019-11-19 MED ORDER — METHYLPREDNISOLONE SODIUM SUCC 125 MG IJ SOLR
45.0000 mg | Freq: Two times a day (BID) | INTRAMUSCULAR | Status: DC
  Filled 2019-11-19: qty 2

## 2019-11-19 MED ORDER — BARICITINIB 2 MG PO TABS: 4.0000 mg | ORAL_TABLET | Freq: Every day | ORAL | Status: DC

## 2019-11-19 MED ORDER — METHYLPREDNISOLONE SODIUM SUCC 40 MG IJ SOLR
40.0000 mg | Freq: Two times a day (BID) | INTRAMUSCULAR | Status: DC
  Administered 2019-11-22 – 2019-11-24 (×5): 40 mg via INTRAVENOUS
  Filled 2019-11-19 (×5): qty 1

## 2019-11-19 MED ORDER — INSULIN ASPART 100 UNIT/ML ~~LOC~~ SOLN
0.0000 [IU] | Freq: Three times a day (TID) | SUBCUTANEOUS | Status: DC
  Administered 2019-11-19: 2 [IU] via SUBCUTANEOUS
  Administered 2019-11-19: 3 [IU] via SUBCUTANEOUS
  Administered 2019-11-19: 5 [IU] via SUBCUTANEOUS
  Administered 2019-11-20: 3 [IU] via SUBCUTANEOUS
  Administered 2019-11-20 (×2): 2 [IU] via SUBCUTANEOUS
  Administered 2019-11-21: 5 [IU] via SUBCUTANEOUS
  Administered 2019-11-21: 2 [IU] via SUBCUTANEOUS

## 2019-11-19 MED ORDER — MAGNESIUM SULFATE 2 GM/50ML IV SOLN
2.0000 g | Freq: Once | INTRAVENOUS | Status: AC
  Administered 2019-11-19: 2 g via INTRAVENOUS
  Filled 2019-11-19: qty 50

## 2019-11-19 MED ORDER — SODIUM CHLORIDE 0.9 % IV SOLN: 100.0000 mg | Freq: Every day | INTRAVENOUS | Status: DC

## 2019-11-19 MED ORDER — AZITHROMYCIN 250 MG PO TABS
500.0000 mg | ORAL_TABLET | Freq: Every day | ORAL | Status: AC
  Administered 2019-11-19: 500 mg via ORAL
  Filled 2019-11-19: qty 2

## 2019-11-19 MED ORDER — PANTOPRAZOLE SODIUM 40 MG PO TBEC
40.0000 mg | DELAYED_RELEASE_TABLET | Freq: Every day | ORAL | Status: DC
  Administered 2019-11-19 – 2019-11-25 (×7): 40 mg via ORAL
  Filled 2019-11-19 (×7): qty 1

## 2019-11-19 MED ORDER — INSULIN ASPART 100 UNIT/ML ~~LOC~~ SOLN: 0.0000 [IU] | Freq: Three times a day (TID) | SUBCUTANEOUS | Status: DC

## 2019-11-19 MED ORDER — METHYLPREDNISOLONE SODIUM SUCC 40 MG IJ SOLR
40.0000 mg | Freq: Four times a day (QID) | INTRAMUSCULAR | Status: AC
  Administered 2019-11-19 – 2019-11-22 (×12): 40 mg via INTRAVENOUS
  Filled 2019-11-19 (×12): qty 1

## 2019-11-19 MED ORDER — FOLIC ACID 1 MG PO TABS
1.0000 mg | ORAL_TABLET | Freq: Every day | ORAL | Status: DC
  Administered 2019-11-19 – 2019-11-25 (×7): 1 mg via ORAL
  Filled 2019-11-19 (×7): qty 1

## 2019-11-19 MED ORDER — ADULT MULTIVITAMIN W/MINERALS CH
1.0000 | ORAL_TABLET | Freq: Every day | ORAL | Status: DC
  Administered 2019-11-19 – 2019-11-25 (×7): 1 via ORAL
  Filled 2019-11-19 (×7): qty 1

## 2019-11-19 MED ORDER — DEXAMETHASONE SODIUM PHOSPHATE 10 MG/ML IJ SOLN
6.0000 mg | Freq: Once | INTRAMUSCULAR | Status: AC
  Administered 2019-11-19: 6 mg via INTRAVENOUS
  Filled 2019-11-19: qty 1

## 2019-11-19 MED ORDER — POTASSIUM CHLORIDE CRYS ER 20 MEQ PO TBCR
40.0000 meq | EXTENDED_RELEASE_TABLET | Freq: Once | ORAL | Status: AC
  Administered 2019-11-19: 40 meq via ORAL
  Filled 2019-11-19: qty 2

## 2019-11-19 MED ORDER — HYDROCOD POLST-CPM POLST ER 10-8 MG/5ML PO SUER
5.0000 mL | Freq: Two times a day (BID) | ORAL | Status: DC | PRN
  Administered 2019-11-19 – 2019-11-23 (×5): 5 mL via ORAL
  Filled 2019-11-19 (×5): qty 5

## 2019-11-19 MED ORDER — POTASSIUM CHLORIDE CRYS ER 20 MEQ PO TBCR
40.0000 meq | EXTENDED_RELEASE_TABLET | Freq: Once | ORAL | Status: DC
  Filled 2019-11-19: qty 2

## 2019-11-19 MED ORDER — VITAMIN D 25 MCG (1000 UNIT) PO TABS
1000.0000 [IU] | ORAL_TABLET | Freq: Every day | ORAL | Status: DC
  Administered 2019-11-19 – 2019-11-25 (×7): 1000 [IU] via ORAL
  Filled 2019-11-19 (×7): qty 1

## 2019-11-19 MED ORDER — SODIUM CHLORIDE 0.9 % IV SOLN
100.0000 mg | Freq: Every day | INTRAVENOUS | Status: AC
  Administered 2019-11-20 – 2019-11-23 (×4): 100 mg via INTRAVENOUS
  Filled 2019-11-19 (×4): qty 20

## 2019-11-19 MED ORDER — ALBUTEROL SULFATE HFA 108 (90 BASE) MCG/ACT IN AERS
2.0000 | INHALATION_SPRAY | Freq: Four times a day (QID) | RESPIRATORY_TRACT | Status: DC
  Administered 2019-11-19 – 2019-11-25 (×23): 2 via RESPIRATORY_TRACT
  Filled 2019-11-19 (×2): qty 6.7

## 2019-11-19 NOTE — ED Provider Notes (Signed)
[facility] EMERGENCY DEPARTMENT Provider Note   CSN: [id number] Arrival date & time: 11/18/19  2133   History Chief Complaint  Patient presents with  . COVID symptoms    Victor Gonzalez is a 34 y.o. male.  The history is provided by the patient. A language interpreter was used.  He comes in complaining of sore throat, cough productive of white sputum, shortness of breath for the last 8 days. He had a subjective fever initially, but does not have one currently. He denies chills or sweats. He has lost his sense of smell and loss his sense of taste. His chest is sore from coughing. He denies nausea or vomiting but has had some diarrhea. He denies arthralgias or myalgias. He denies any sick contacts. He has not been vaccinated against COVID-19.  History reviewed. No pertinent past medical history.  There are no problems to display for this patient.   History reviewed. No pertinent surgical history.     No family history on file.  Social History   Tobacco Use  . Smoking status: Never Smoker  . Smokeless tobacco: Never Used  Substance Use Topics  . Alcohol use: No  . Drug use: No    Home Medications Prior to Admission medications   Medication Sig Start Date End Date Taking? Authorizing Provider  erythromycin ophthalmic ointment Place 1 application 3 (three) times daily into the left eye. 01/09/17   Myles Lipps, MD    Allergies    Patient has no known allergies.  Review of Systems   Review of Systems  All other systems reviewed and are negative.   Physical Exam Updated Vital Signs BP 120/78   Pulse 73   Temp (!) 100.7 F (38.2 C) (Oral)   Resp 20   SpO2 96%   Physical Exam Vitals and nursing note reviewed.   34 year old male, resting comfortably and in no acute distress. Vital signs are significant for low-grade fever. Oxygen saturation is 96%, which is normal. Head is normocephalic and atraumatic. PERRLA, EOMI. Oropharynx  is clear. Neck is nontender and supple without adenopathy or JVD. Back is nontender and there is no CVA tenderness. Lungs are clear without rales, wheezes, or rhonchi. Chest is nontender. Heart has regular rate and rhythm without murmur. Abdomen is soft, flat, nontender without masses or hepatosplenomegaly and peristalsis is normoactive. Extremities have no cyanosis or edema, full range of motion is present. Skin is warm and dry without rash. Neurologic: Mental status is normal, cranial nerves are intact, there are no motor or sensory deficits.  ED Results / Procedures / Treatments   Labs (all labs ordered are listed, but only abnormal results are displayed) Labs Reviewed  RESPIRATORY PANEL BY RT PCR (FLU A&B, COVID) - Abnormal; Notable for the following components:      Result Value   SARS Coronavirus 2 by RT PCR POSITIVE (*)    All other components within normal limits  COMPREHENSIVE METABOLIC PANEL - Abnormal; Notable for the following components:   Potassium 3.3 (*)    Glucose, Bld 131 (*)    Calcium 8.5 (*)    AST 46 (*)    ALT 56 (*)    All other components within normal limits  CBC - Abnormal; Notable for the following components:   WBC 11.7 (*)    All other components within normal limits   Radiology DG Chest Portable 1 View  Result Date: 11/19/2019 CLINICAL DATA:  Fever and shortness of breath, COVID-19 positivity  EXAM: PORTABLE CHEST 1 VIEW COMPARISON:  05/24/2010 FINDINGS: Cardiac shadow is within normal limits. Patchy airspace opacities are noted primarily in the bases consistent with the given clinical history. No acute bony abnormality is noted. Postsurgical changes in the left clavicle are noted. IMPRESSION: Patchy airspace opacities consistent with the given clinical history of COVID-19 positivity. Electronically Signed   By: Alcide Clever M.D.   On: 11/19/2019 01:18    Procedures Procedures   Medications Ordered in ED Medications  dexamethasone (DECADRON)  injection 6 mg (has no administration in time range)  potassium chloride SA (KLOR-CON) CR tablet 40 mEq (40 mEq Oral Given 11/19/19 0439)    ED Course  I have reviewed the triage vital signs and the nursing notes.  Pertinent labs & imaging results that were available during my care of the patient were reviewed by me and considered in my medical decision making (see chart for details).  MDM Rules/Calculators/A&P Flulike illness but, with loss of sense of smell, concerning for COVID-19. COVID-19 test is positive. Chest x-ray shows patchy airspace disease consistent with COVID-19 pneumonia. Labs show mild hypokalemia and he is given a dose of oral potassium. He is reported to have had a low oxygen saturation in triage. I took his nasal oxygen off while talking to him and oxygen saturation was stable at 92%. Will ambulate to see if he desaturates with exertion.  Oxygen saturation remained adequate with ambulation.  However, when he was put back into bed, oxygen saturation dropped to 82%.  He is placed back on nasal oxygen and will need to be admitted because of new oxygen requirement.  He is given a dose of dexamethasone.  Case is discussed with  Dr. Margo Aye of [facility], who agrees to admit the patient.  Victor Gonzalez was evaluated in Emergency Department on 11/19/2019 for the symptoms described in the history of present illness. He was evaluated in the context of the global COVID-19 pandemic, which necessitated consideration that the patient might be at risk for infection with the SARS-CoV-2 virus that causes COVID-19. Institutional protocols and algorithms that pertain to the evaluation of patients at risk for COVID-19 are in a state of rapid change based on information released by regulatory bodies including the CDC and federal and state organizations. These policies and algorithms were followed during the patient's care in the ED.  Final Clinical Impression(s) / ED Diagnoses Final  diagnoses:  COVID-19 virus infection  Hypokalemia    Rx / DC Orders ED Discharge Orders    None       Dione Booze, MD 11/19/19 [phone]

## 2019-11-19 NOTE — ED Notes (Signed)
ED Provider at bedside. 

## 2019-11-19 NOTE — H&P (Addendum)
History and Physical  Victor Gonzalez HER:[id number] DOB: 21-May-1985 DOA: 11/18/2019  Referring physician: Dr. Preston Fleeting  PCP: Patient, No Pcp Per  Outpatient Specialists: None Patient coming from: Home  Chief Complaint: Shortness of breath.  HPI: Victor Gonzalez is a 34 y.o. male with medical history significant for none prior who presents to [facility] ED from home with complaints of shortness of breath of 8 days duration.  Associated with a productive cough of white sputum, sore throat, subjective fever, pleuritic pain worse when he coughs, loss of sense of smell and taste.  He denies abdominal pain, nausea, or vomiting, admits to diarrhea.  He has not tested previously and has not received monoclonal antibodies.  He has declined Remdesivir and Baricitinib for now, he will discuss with his brother prior to making his final decision.  Please readdress with the patient to obtain a final decision.  Interpreter Christiane Ha [phone].   ED Course: Febrile with T-max of 100.7, tachycardic, 106.  Lab studies remarkable for serum potassium 3.3, mildly elevated AST and ALT, mildly elevated WBC 11.7K.  Chest x-ray personally reviewed revealed bilateral pulmonary infiltrates consistent with COVID-19 viral pneumonia.  Review of Systems: Review of systems as noted in the HPI. All other systems reviewed and are negative.  Past medical history: States none Past surgical history: Left shoulder surgery.  Social History:  reports that he has never smoked. He has never used smokeless tobacco. He reports that he does not drink alcohol and does not use drugs.   No Known Allergies  No family history on file.  States no one in his family has history of heart disease, diabetes, hypertension, CVA, or MI.  Prior to Admission medications   Not on File    Physical Exam: BP 128/87   Pulse 71   Temp 98.8 F (37.1 C) (Oral)   Resp 20   SpO2 95%   . General: 34 y.o. year-old male well developed  well nourished in no acute distress.  Alert and oriented x3. . Cardiovascular: Regular rate and rhythm with no rubs or gallops.  No thyromegaly or JVD noted.  No lower extremity edema. 2/4 pulses in all 4 extremities. Marland Kitchen Respiratory: Diffuse rales bilaterally, mild wheezing noted.  Good inspiratory effort. . Abdomen: Soft nontender nondistended with normal bowel sounds x4 quadrants. . Muskuloskeletal: No cyanosis, clubbing or edema noted bilaterally . Neuro: CN II-XII intact, strength, sensation, reflexes . Skin: No ulcerative lesions noted or rashes . Psychiatry: Judgement and insight appear normal. Mood is appropriate for condition and setting          Labs on Admission:  Basic Metabolic Panel: Recent Labs  Lab 11/18/19 2219  NA 139  K 3.3*  CL 103  CO2 22  GLUCOSE 131*  BUN 13  CREATININE 0.75  CALCIUM 8.5*   Liver Function Tests: Recent Labs  Lab 11/18/19 2219  AST 46*  ALT 56*  ALKPHOS 89  BILITOT 0.9  PROT 7.4  ALBUMIN 3.6   No results for input(s): LIPASE, AMYLASE in the last 168 hours. No results for input(s): AMMONIA in the last 168 hours. CBC: Recent Labs  Lab 11/18/19 2219  WBC 11.7*  HGB 16.4  HCT 47.9  MCV 90.9  PLT 297   Cardiac Enzymes: No results for input(s): CKTOTAL, CKMB, CKMBINDEX, TROPONINI in the last 168 hours.  BNP (last 3 results) No results for input(s): BNP in the last 8760 hours.  ProBNP (last 3 results) No results for input(s): PROBNP in the last 8760 hours.  CBG: No results for input(s): GLUCAP in the last 168 hours.  Radiological Exams on Admission: DG Chest Portable 1 View  Result Date: 11/19/2019 CLINICAL DATA:  Fever and shortness of breath, COVID-19 positivity EXAM: PORTABLE CHEST 1 VIEW COMPARISON:  05/24/2010 FINDINGS: Cardiac shadow is within normal limits. Patchy airspace opacities are noted primarily in the bases consistent with the given clinical history. No acute bony abnormality is noted. Postsurgical changes in  the left clavicle are noted. IMPRESSION: Patchy airspace opacities consistent with the given clinical history of COVID-19 positivity. Electronically Signed   By: Alcide Clever M.D.   On: 11/19/2019 01:18    EKG: I independently viewed the EKG done and my findings are as followed: None available at the time of this exam.  Assessment/Plan Present on Admission: . Pneumonia due to COVID-19 virus  Active Problems:   Pneumonia due to COVID-19 virus  COVID-19 viral pneumonia Presented with shortness of breath of 8 days duration associated with a productive cough with white sputum. COVID-19 screening test positive for 11-18-19 Obtain inflammatory markers He has declined Remdesivir and Baricitinib for now, he will discuss with his brother prior to making his final decision.  Please readdress with the patient to obtain a final decision. Start IV Solu-Medrol twice daily, albuterol inhaler 2 puffs every 6 hours, Atrovent inhaler 2 puffs every 6 hours, added Z-Pak for its anti-inflammatory properties Vitamin C, D3 and zinc Start incentive spirometer, mobilize as tolerated Pronate, side sleeping Monitor inflammatory markers daily  Acute hypoxic respiratory failure secondary to COVID-19 viral pneumonia Not on oxygen supplementation at baseline Currently on 2 L with O2 saturation in the low to mid 90 at rest Maintain O2 saturation greater than 92%  Leukocytosis  Mildly elevated WBC 11.7K Productive cough Obtain a procalcitonin level to rule out superimposed bacterial infection Obtain MRSA screen Start Z-Pak for its anti-inflammatory properties  Hypokalemia likely from GI losses with diarrhea Presented with serum potassium of 3.3 Replete as indicated Add on magnesium level  Diarrhea, likely secondary to COVID-19 viral infection Monitor for now Avoid IV fluid hydration Encourage to increase oral intake to avoid dehydration    DVT prophylaxis: Subcu Lovenox daily  Code Status: Full code,  per the patient himself.  Family Communication: None at bedside.  Updated his brother [place] [phone] via phone.  Disposition Plan: Admit to telemetry medical  Consults called: None  Admission status: Inpatient status.   Status is: Inpatient    Dispo:  Patient From: Home  Planned Disposition: Home  Expected discharge date: 11/21/19  Medically stable for discharge: No, ongoing management of COVID-19 viral pneumonia.        Darlin Drop MD [facility] Pager [phone]  If 7PM-7AM, please contact night-coverage [email or web address] Password [email or web address]  11/19/2019, 5:18 AM

## 2019-11-19 NOTE — ED Notes (Signed)
Pt provided with urinal. Pt steady on feet.

## 2019-11-19 NOTE — Progress Notes (Signed)
PROGRESS NOTE    Victor Gonzalez  XLK:[id number] DOB: [birth year] DOA: 11/18/2019 PCP: Patient, No Pcp Per   Chief Complaint  Patient presents with   COVID symptoms    Brief Narrative:  Victor Gonzalez is Victor Gonzalez 34 y.o. male with medical history significant for none prior who presents to [facility] ED from home with complaints of shortness of breath of 8 days duration.  Associated with Victor Gonzalez productive cough of white sputum, sore throat, subjective fever, pleuritic pain worse when he coughs, loss of sense of smell and taste.  He denies abdominal pain, nausea, or vomiting, admits to diarrhea.  He has not tested previously and has not received monoclonal antibodies.  He initially declined remdesivir and baricitinib.  After further discussion, he's now agreeable to remdesivir.  Assessment & Plan:   Active Problems:   Pneumonia due to COVID-19 virus   COVID-19  Acute Hypoxic Respiratory Failure 2/2 COVID-19 viral pneumonia Presented with shortness of breath of 8 days duration associated with Victor Gonzalez productive cough with white sputum. COVID-19 screening test positive for 11-18-19 CXR 9/24 with patchy airspace opacities c/w covid 19 positivity Oxygen needs increasing, on 5 L on my evaluation. Continue high dose steroids.  Had additional discussion with him about remdesivir, he's agreeable at this time.  Still not interested in baricitinib at this time, discussed if he gets worse will revisit discussion. Procal 0.11.  Azithromycin per admitting provider, check EKG for QT eval. Vitamin C, D3 and zinc Strict I/O, daily weights Prone as able, OOB, IS, flutter, therapy  COVID-19 Labs  Recent Labs    11/19/19 0620  DDIMER 0.44    Lab Results  Component Value Date   SARSCOV2NAA POSITIVE (Victor Gonzalez) 11/18/2019   Leukocytosis  Continue to monitor, improved today Procal 0.11  Hypokalemia likely from GI losses with diarrhea improved  Diarrhea, likely secondary to COVID-19 viral  infection Monitor for now Encourage to increase oral intake to avoid dehydration  Hyperglycemia Follow A1c, SSI  DVT prophylaxis: lovenox Code Status: full  Family Communication: none at bedside Disposition:   Status is: Inpatient  Remains inpatient appropriate because:Inpatient level of care appropriate due to severity of illness   Dispo:  Patient From: Home  Planned Disposition: Home  Expected discharge date: 11/21/19  Medically stable for discharge: No   Consultants:   none  Procedures:   none  Antimicrobials:  Anti-infectives (From admission, onward)   Start     Dose/Rate Route Frequency Ordered Stop   11/20/19 1000  remdesivir 100 mg in sodium chloride 0.9 % 100 mL IVPB  Status:  Discontinued       "Followed by" Linked Group Details   100 mg 200 mL/hr over 30 Minutes Intravenous Daily 11/19/19 0515 11/19/19 0606   11/20/19 1000  azithromycin (ZITHROMAX) tablet 250 mg       "Followed by" Linked Group Details   250 mg Oral Daily 11/19/19 0632 11/24/19 0959   11/20/19 1000  remdesivir 100 mg in sodium chloride 0.9 % 100 mL IVPB       "Followed by" Linked Group Details   100 mg 200 mL/hr over 30 Minutes Intravenous Daily 11/19/19 0845 11/24/19 0959   11/19/19 1000  azithromycin (ZITHROMAX) tablet 500 mg       "Followed by" Linked Group Details   500 mg Oral Daily 11/19/19 0632 11/19/19 0818   11/19/19 1000  remdesivir 200 mg in sodium chloride 0.9% 250 mL IVPB       "Followed by" Linked Group Details  200 mg 580 mL/hr over 30 Minutes Intravenous Once 11/19/19 0845     11/19/19 0600  remdesivir 200 mg in sodium chloride 0.9% 250 mL IVPB  Status:  Discontinued       "Followed by" Linked Group Details   200 mg 580 mL/hr over 30 Minutes Intravenous Once 11/19/19 0515 11/19/19 0606     Subjective: No new complaints Notes cough and SOB Discussed with interpreter  Objective: Vitals:   11/19/19 0700 11/19/19 0900 11/19/19 0941 11/19/19 0958  BP:  133/78     Pulse:  (!) 101 90 92  Resp:  (!) 28 (!) 23 (!) 25  Temp:      TempSrc:      SpO2:  91% 90% 95%  Weight: 86.2 kg       Intake/Output Summary (Last 24 hours) at 11/19/2019 1009 Last data filed at 11/19/2019 3825 Gross per 24 hour  Intake 50 ml  Output --  Net 50 ml   Filed Weights   11/19/19 0700  Weight: 86.2 kg    Examination:  General exam: Appears calm and comfortable  Respiratory system: diffuse crackles Cardiovascular system: S1 & S2 heard, RRR. Gastrointestinal system: Abdomen is nondistended, soft and nontender. Central nervous system: Alert and oriented. No focal neurological deficits. Extremities: no LEE Skin: No rashes, lesions or ulcers Psychiatry: Judgement and insight appear normal. Mood & affect appropriate.     Data Reviewed: I have personally reviewed following labs and imaging studies  CBC: Recent Labs  Lab 11/18/19 2219 11/19/19 0620  WBC 11.7* 9.6  NEUTROABS  --  8.5*  HGB 16.4 15.8  HCT 47.9 46.4  MCV 90.9 92.2  PLT 297 290    Basic Metabolic Panel: Recent Labs  Lab 11/18/19 2219 11/19/19 0620  NA 139 139  K 3.3* 4.1  CL 103 100  CO2 22 26  GLUCOSE 131* 164*  BUN 13 13  CREATININE 0.75 0.80  CALCIUM 8.5* 8.6*  MG  --  2.1  PHOS  --  3.6    GFR: CrCl cannot be calculated (Unknown ideal weight.).  Liver Function Tests: Recent Labs  Lab 11/18/19 2219 11/19/19 0620  AST 46* 40  ALT 56* 47*  ALKPHOS 89 74  BILITOT 0.9 1.1  PROT 7.4 7.1  ALBUMIN 3.6 3.4*    CBG: Recent Labs  Lab 11/19/19 0750  GLUCAP 172*     Recent Results (from the past 240 hour(s))  Respiratory Panel by RT PCR (Flu Victor Gonzalez&B, Covid) - Nasopharyngeal Swab     Status: Abnormal   Collection Time: 11/18/19 10:30 PM   Specimen: Nasopharyngeal Swab  Result Value Ref Range Status   SARS Coronavirus 2 by RT PCR POSITIVE (Victor Gonzalez) NEGATIVE Final    Comment: RESULT CALLED TO, READ BACK BY AND VERIFIED WITH: Victor Gonzalez @2330  11/18/19 EB (NOTE) SARS-CoV-2 target  nucleic acids are DETECTED.  SARS-CoV-2 RNA is generally detectable in upper respiratory specimens  during the acute phase of infection. Positive results are indicative of the presence of the identified virus, but do not rule out bacterial infection or co-infection with other pathogens not detected by the test. Clinical correlation with patient history and other diagnostic information is necessary to determine patient infection status. The expected result is Negative.  Fact Sheet for Patients:  11/20/19  Fact Sheet for Healthcare Providers: https://www.moore.com/  This test is not yet approved or cleared by the https://www.young.biz/ FDA and  has been authorized for detection and/or diagnosis of SARS-CoV-2 by FDA under an  Emergency Use Authorization (EUA).  This EUA will remain in effect (meaning this test can be used) fo r the duration of  the COVID-19 declaration under Section 564(b)(1) of the Act, 21 U.S.C. section 360bbb-3(b)(1), unless the authorization is terminated or revoked sooner.      Influenza Brentney Goldbach by PCR NEGATIVE NEGATIVE Final   Influenza B by PCR NEGATIVE NEGATIVE Final    Comment: (NOTE) The Xpert Xpress SARS-CoV-2/FLU/RSV assay is intended as an aid in  the diagnosis of influenza from Nasopharyngeal swab specimens and  should not be used as Nina Mondor sole basis for treatment. Nasal washings and  aspirates are unacceptable for Xpert Xpress SARS-CoV-2/FLU/RSV  testing.  Fact Sheet for Patients: https://www.moore.com/  Fact Sheet for Healthcare Providers: https://www.young.biz/  This test is not yet approved or cleared by the [place] FDA and  has been authorized for detection and/or diagnosis of SARS-CoV-2 by  FDA under an Emergency Use Authorization (EUA). This EUA will remain  in effect (meaning this test can be used) for the duration of the  Covid-19 declaration under Section  564(b)(1) of the Act, 21  U.S.C. section 360bbb-3(b)(1), unless the authorization is  terminated or revoked. Performed at [facility], [address] [place], [place] [address]          Radiology Studies: DG Chest Portable 1 View  Result Date: 11/19/2019 CLINICAL DATA:  Fever and shortness of breath, COVID-19 positivity EXAM: PORTABLE CHEST 1 VIEW COMPARISON:  05/24/2010 FINDINGS: Cardiac shadow is within normal limits. Patchy airspace opacities are noted primarily in the bases consistent with the given clinical history. No acute bony abnormality is noted. Postsurgical changes in the left clavicle are noted. IMPRESSION: Patchy airspace opacities consistent with the given clinical history of COVID-19 positivity. Electronically Signed   By: Alcide Clever M.D.   On: 11/19/2019 01:18        Scheduled Meds:  albuterol  2 puff Inhalation Q6H   vitamin C  500 mg Oral Daily   [START ON 11/20/2019] azithromycin  250 mg Oral Daily   cholecalciferol  1,000 Units Oral Daily   enoxaparin (LOVENOX) injection  40 mg Subcutaneous Q24H   folic acid  1 mg Oral Daily   insulin aspart  0-9 Units Subcutaneous TID WC   ipratropium  2 puff Inhalation Q8H   methylPREDNISolone (SOLU-MEDROL) injection  40 mg Intravenous Q6H   Followed by   Melene Muller ON 11/22/2019] methylPREDNISolone (SOLU-MEDROL) injection  40 mg Intravenous Q12H   multivitamin with minerals  1 tablet Oral Daily   pantoprazole  40 mg Oral Daily   thiamine  100 mg Oral Daily   zinc sulfate  220 mg Oral Daily   Continuous Infusions:  remdesivir 200 mg in sodium chloride 0.9% 250 mL IVPB     Followed by   Melene Muller ON 11/20/2019] remdesivir 100 mg in NS 100 mL       LOS: 0 days    Time spent: over 30 min    Lacretia Nicks, MD [facility]   To contact the attending provider between 7A-7P or the covering provider during after hours 7P-7A, please log into the web site [email or web address] and access using  universal [place] password for that web site. If you do not have the password, please call the hospital operator.  11/19/2019, 10:09 AM

## 2019-11-19 NOTE — ED Notes (Signed)
Pt provided with water and a sandwich

## 2019-11-19 NOTE — ED Notes (Signed)
Pt pulse oximetry was 90-91% while ambulating.

## 2019-11-19 NOTE — ED Notes (Addendum)
Pt found to have an oxygen saturation of 80% on 5L [place]. Titrated patient to 6L [place] with improvement of oxygen saturation of 90%. Notified MD of patinet's condition. Pt to be placed on high flow [place]. Pt in NAD. Resp even and unlabored. Continuous pulse ox and cardiac monitoring equipment in place.

## 2019-11-19 NOTE — ED Notes (Addendum)
Upon couhging patinet's O2 saturation dropped to 84% on RA. Sat patient upright and placed patient on 2L [place]. O2 saturation at this time is 92%. Pt in NAD.

## 2019-11-20 LAB — CBC WITH DIFFERENTIAL/PLATELET
Abs Immature Granulocytes: 0.1 10*3/uL — ABNORMAL HIGH (ref 0.00–0.07)
Basophils Absolute: 0 10*3/uL (ref 0.0–0.1)
Basophils Relative: 0 %
Eosinophils Absolute: 0 10*3/uL (ref 0.0–0.5)
Eosinophils Relative: 0 %
HCT: 46.8 % (ref 39.0–52.0)
Hemoglobin: 15.7 g/dL (ref 13.0–17.0)
Immature Granulocytes: 1 %
Lymphocytes Relative: 8 %
Lymphs Abs: 0.8 10*3/uL (ref 0.7–4.0)
MCH: 30.8 pg (ref 26.0–34.0)
MCHC: 33.5 g/dL (ref 30.0–36.0)
MCV: 91.9 fL (ref 80.0–100.0)
Monocytes Absolute: 0.4 10*3/uL (ref 0.1–1.0)
Monocytes Relative: 4 %
Neutro Abs: 8.5 10*3/uL — ABNORMAL HIGH (ref 1.7–7.7)
Neutrophils Relative %: 87 %
Platelets: 306 10*3/uL (ref 150–400)
RBC: 5.09 MIL/uL (ref 4.22–5.81)
RDW: 12.1 % (ref 11.5–15.5)
WBC: 9.8 10*3/uL (ref 4.0–10.5)
nRBC: 0 % (ref 0.0–0.2)

## 2019-11-20 LAB — FERRITIN: Ferritin: 445 ng/mL — ABNORMAL HIGH (ref 24–336)

## 2019-11-20 LAB — COMPREHENSIVE METABOLIC PANEL
ALT: 57 U/L — ABNORMAL HIGH (ref 0–44)
AST: 31 U/L (ref 15–41)
Albumin: 3.2 g/dL — ABNORMAL LOW (ref 3.5–5.0)
Alkaline Phosphatase: 74 U/L (ref 38–126)
Anion gap: 12 (ref 5–15)
BUN: 16 mg/dL (ref 6–20)
CO2: 28 mmol/L (ref 22–32)
Calcium: 8.9 mg/dL (ref 8.9–10.3)
Chloride: 104 mmol/L (ref 98–111)
Creatinine, Ser: 0.77 mg/dL (ref 0.61–1.24)
GFR calc Af Amer: >60 mL/min (ref >60)
GFR calc non Af Amer: >60 mL/min (ref >60)
Glucose, Bld: 159 mg/dL — ABNORMAL HIGH (ref 70–99)
Potassium: 4 mmol/L (ref 3.5–5.1)
Sodium: 144 mmol/L (ref 135–145)
Total Bilirubin: 1 mg/dL (ref 0.3–1.2)
Total Protein: 7.3 g/dL (ref 6.5–8.1)

## 2019-11-20 LAB — D-DIMER, QUANTITATIVE: D-Dimer, Quant: 0.28 ug/mL-FEU (ref 0.00–0.50)

## 2019-11-20 LAB — MAGNESIUM: Magnesium: 2.5 mg/dL — ABNORMAL HIGH (ref 1.7–2.4)

## 2019-11-20 LAB — PROCALCITONIN: Procalcitonin: <0.1 ng/mL

## 2019-11-20 LAB — GLUCOSE, CAPILLARY
Glucose-Capillary: 192 mg/dL — ABNORMAL HIGH (ref 70–99)
Glucose-Capillary: 185 mg/dL — ABNORMAL HIGH (ref 70–99)
Glucose-Capillary: 208 mg/dL — ABNORMAL HIGH (ref 70–99)
Glucose-Capillary: 179 mg/dL — ABNORMAL HIGH (ref 70–99)

## 2019-11-20 LAB — PHOSPHORUS: Phosphorus: 3.5 mg/dL (ref 2.5–4.6)

## 2019-11-20 LAB — C-REACTIVE PROTEIN: CRP: 10.3 mg/dL — ABNORMAL HIGH (ref <1.0)

## 2019-11-20 MED ORDER — BARICITINIB 2 MG PO TABS
4.0000 mg | ORAL_TABLET | Freq: Every day | ORAL | Status: DC
  Administered 2019-11-20 – 2019-11-25 (×6): 4 mg via ORAL
  Filled 2019-11-20 (×7): qty 2

## 2019-11-20 NOTE — Progress Notes (Signed)
Patient had a coughing spell and oxygen dropped to the 70's on 8 L high flow nasal canula.  Cough medication given and patient was placed in prone position.  No he is sating at 94% on 8 L. High flow nasal canula.  Will continue to monitor.

## 2019-11-20 NOTE — Plan of Care (Signed)
  Problem: Education: Goal: Knowledge of risk factors and measures for prevention of condition will improve Outcome: Progressing   Problem: Coping: Goal: Psychosocial and spiritual needs will be supported Outcome: Progressing   Problem: Respiratory: Goal: Will maintain a patent airway Outcome: Progressing   Problem: Clinical Measurements: Goal: Ability to maintain clinical measurements within normal limits will improve Outcome: Progressing Goal: Respiratory complications will improve Outcome: Progressing

## 2019-11-20 NOTE — Progress Notes (Addendum)
PROGRESS NOTE    Victor Gonzalez  ENI:[id number] DOB: [birth year] DOA: 11/18/2019 PCP: Patient, No Pcp Per   Chief Complaint  Patient presents with  . COVID symptoms    Brief Narrative:  Victor Gonzalez is Victor Gonzalez 34 y.o. male with medical history significant for none prior who presents to [facility] ED from home with complaints of shortness of breath of 8 days duration.  Associated with Sylvan Sookdeo productive cough of white sputum, sore throat, subjective fever, pleuritic pain worse when he coughs, loss of sense of smell and taste.  He denies abdominal pain, nausea, or vomiting, admits to diarrhea.  He has not tested previously and has not received monoclonal antibodies.  He initially declined remdesivir and baricitinib.  After further discussion, he's now agreeable to remdesivir.  Assessment & Plan:   Active Problems:   Pneumonia due to COVID-19 virus   COVID-19 virus infection  Acute Hypoxic Respiratory Failure 2/2 COVID-19 viral pneumonia Presented with shortness of breath of 8 days duration associated with Zendaya Groseclose productive cough with white sputum. COVID-19 screening test positive for 11-18-19 CXR 9/24 with patchy airspace opacities c/w covid 19 positivity Oxygen needs increasing, on 10 L this AM Continue high dose steroids, remedesivir.  Discussed risks/benefits of baricitinib, off label use, risk infection/vte/malignancy.  Denies tb/hepatitis.  Had extensive discussion, he's agreeable at this time - will start today.    Will d/c azithromycin, low suspicion for bacterial infection Vitamin C, D3 and zinc Strict I/O, daily weights Prone as able, OOB, IS, flutter, therapy  COVID-19 Labs  Recent Labs    11/19/19 0620 11/20/19 0131  DDIMER 0.44 0.28  FERRITIN 386* 445*  CRP 9.2* 10.3*    Lab Results  Component Value Date   SARSCOV2NAA POSITIVE (Deontra Pereyra) 11/18/2019   Leukocytosis  Continue to monitor, improved today procal improved  Hypokalemia likely from GI losses with  diarrhea improved  Diarrhea, likely secondary to COVID-19 viral infection Monitor for now Encourage to increase oral intake to avoid dehydration  Prediabetes  Steroid Induced Hyperglycemia Follow A1c, SSI  DVT prophylaxis: lovenox Code Status: full  Family Communication: none at bedside Disposition:   Status is: Inpatient  Remains inpatient appropriate because:Inpatient level of care appropriate due to severity of illness   Dispo:  Patient From: Home  Planned Disposition: Home  Expected discharge date: 11/21/19  Medically stable for discharge: No   Consultants:   none  Procedures:   none  Antimicrobials:  Anti-infectives (From admission, onward)   Start     Dose/Rate Route Frequency Ordered Stop   11/20/19 1000  remdesivir 100 mg in sodium chloride 0.9 % 100 mL IVPB  Status:  Discontinued       "Followed by" Linked Group Details   100 mg 200 mL/hr over 30 Minutes Intravenous Daily 11/19/19 0515 11/19/19 0606   11/20/19 1000  azithromycin (ZITHROMAX) tablet 250 mg       "Followed by" Linked Group Details   250 mg Oral Daily 11/19/19 0632 11/24/19 0959   11/20/19 1000  remdesivir 100 mg in sodium chloride 0.9 % 100 mL IVPB       "Followed by" Linked Group Details   100 mg 200 mL/hr over 30 Minutes Intravenous Daily 11/19/19 0845 11/24/19 0959   11/19/19 1000  azithromycin (ZITHROMAX) tablet 500 mg       "Followed by" Linked Group Details   500 mg Oral Daily 11/19/19 0632 11/19/19 0818   11/19/19 1000  remdesivir 200 mg in sodium chloride 0.9% 250 mL IVPB       "  Followed by" Linked Group Details   200 mg 580 mL/hr over 30 Minutes Intravenous Once 11/19/19 0845 11/19/19 1228   11/19/19 0600  remdesivir 200 mg in sodium chloride 0.9% 250 mL IVPB  Status:  Discontinued       "Followed by" Linked Group Details   200 mg 580 mL/hr over 30 Minutes Intravenous Once 11/19/19 0515 11/19/19 0606     Subjective: Interpreter used Asking how he's doing, feels about the  same Discussed concern with increasing oxygen requirements Discussed baricitinib   Objective: Vitals:   11/20/19 0000 11/20/19 0336 11/20/19 0723 11/20/19 0800  BP: 125/81 114/72 121/75 118/80  Pulse: 78 76 85 86  Resp:  (!) 21 (!) 32 20  Temp: 98.3 F (36.8 C) 98 F (36.7 C)  98.2 F (36.8 C)  TempSrc: Oral Oral  Oral  SpO2: 96% (!) 88% (!) 88% (!) 83%  Weight:  93.7 kg    Height:        Intake/Output Summary (Last 24 hours) at 11/20/2019 0930 Last data filed at 11/20/2019 0845 Gross per 24 hour  Intake 630 ml  Output 900 ml  Net -270 ml   Filed Weights   11/19/19 0700 11/19/19 1000 11/20/19 0336  Weight: 86.2 kg 86.2 kg 93.7 kg    Examination:  General: No acute distress. Cardiovascular: Heart sounds show Vikas Wegmann regular rate, and rhythm. Lungs: scattered crackles, diminished Abdomen: Soft, nontender, nondistended  Neurological: Alert and oriented 3. Moves all extremities 4. Cranial nerves II through XII grossly intact. Skin: Warm and dry. No rashes or lesions. Extremities: No clubbing or cyanosis. No edema.  Data Reviewed: I have personally reviewed following labs and imaging studies  CBC: Recent Labs  Lab 11/18/19 2219 11/19/19 0620 11/20/19 0131  WBC 11.7* 9.6 9.8  NEUTROABS  --  8.5* 8.5*  HGB 16.4 15.8 15.7  HCT 47.9 46.4 46.8  MCV 90.9 92.2 91.9  PLT 297 290 306    Basic Metabolic Panel: Recent Labs  Lab 11/18/19 2219 11/19/19 0620 11/20/19 0131  NA 139 139 144  K 3.3* 4.1 4.0  CL 103 100 104  CO2 22 26 28   GLUCOSE 131* 164* 159*  BUN 13 13 16   CREATININE 0.75 0.80 0.77  CALCIUM 8.5* 8.6* 8.9  MG  --  2.1 2.5*  PHOS  --  3.6 3.5    GFR: Estimated Creatinine Clearance: 139.5 mL/min (by C-G formula based on SCr of 0.77 mg/dL).  Liver Function Tests: Recent Labs  Lab 11/18/19 2219 11/19/19 0620 11/20/19 0131  AST 46* 40 31  ALT 56* 47* 57*  ALKPHOS 89 74 74  BILITOT 0.9 1.1 1.0  PROT 7.4 7.1 7.3  ALBUMIN 3.6 3.4* 3.2*     CBG: Recent Labs  Lab 11/19/19 1146 11/19/19 1414 11/19/19 1820 11/19/19 2040 11/20/19 0728  GLUCAP 193* 203* 264* 225* 192*     Recent Results (from the past 240 hour(s))  Respiratory Panel by RT PCR (Flu Mykael Trott&B, Covid) - Nasopharyngeal Swab     Status: Abnormal   Collection Time: 11/18/19 10:30 PM   Specimen: Nasopharyngeal Swab  Result Value Ref Range Status   SARS Coronavirus 2 by RT PCR POSITIVE (Celester Lech) NEGATIVE Final    Comment: RESULT CALLED TO, READ BACK BY AND VERIFIED WITH: J,GLOSTER @2330  11/18/19 EB (NOTE) SARS-CoV-2 target nucleic acids are DETECTED.  SARS-CoV-2 RNA is generally detectable in upper respiratory specimens  during the acute phase of infection. Positive results are indicative of the presence of the  identified virus, but do not rule out bacterial infection or co-infection with other pathogens not detected by the test. Clinical correlation with patient history and other diagnostic information is necessary to determine patient infection status. The expected result is Negative.  Fact Sheet for Patients:  https://www.moore.com/https://www.fda.gov/media/142436/download  Fact Sheet for Healthcare Providers: https://www.young.biz/https://www.fda.gov/media/142435/download  This test is not yet approved or cleared by the [place] FDA and  has been authorized for detection and/or diagnosis of SARS-CoV-2 by FDA under an Emergency Use Authorization (EUA).  This EUA will remain in effect (meaning this test can be used) fo r the duration of  the COVID-19 declaration under Section 564(b)(1) of the Act, 21 U.S.C. section 360bbb-3(b)(1), unless the authorization is terminated or revoked sooner.      Influenza Sammantha Mehlhaff by PCR NEGATIVE NEGATIVE Final   Influenza B by PCR NEGATIVE NEGATIVE Final    Comment: (NOTE) The Xpert Xpress SARS-CoV-2/FLU/RSV assay is intended as an aid in  the diagnosis of influenza from Nasopharyngeal swab specimens and  should not be used as Bearl Talarico sole basis for treatment. Nasal  washings and  aspirates are unacceptable for Xpert Xpress SARS-CoV-2/FLU/RSV  testing.  Fact Sheet for Patients: https://www.moore.com/https://www.fda.gov/media/142436/download  Fact Sheet for Healthcare Providers: https://www.young.biz/https://www.fda.gov/media/142435/download  This test is not yet approved or cleared by the [place] FDA and  has been authorized for detection and/or diagnosis of SARS-CoV-2 by  FDA under an Emergency Use Authorization (EUA). This EUA will remain  in effect (meaning this test can be used) for the duration of the  Covid-19 declaration under Section 564(b)(1) of the Act, 21  U.S.C. section 360bbb-3(b)(1), unless the authorization is  terminated or revoked. Performed at [facility], [address] [place], [place] [address]   MRSA PCR Screening     Status: None   Collection Time: 11/19/19  6:38 AM   Specimen: Nasopharyngeal  Result Value Ref Range Status   MRSA by PCR NEGATIVE NEGATIVE Final    Comment:        The GeneXpert MRSA Assay (FDA approved for NASAL specimens only), is one component of Pradeep Beaubrun comprehensive MRSA colonization surveillance program. It is not intended to diagnose MRSA infection nor to guide or monitor treatment for MRSA infections. Performed at [facility], [address] [place], [place] [address]          Radiology Studies: DG Chest Portable 1 View  Result Date: 11/19/2019 CLINICAL DATA:  Fever and shortness of breath, COVID-19 positivity EXAM: PORTABLE CHEST 1 VIEW COMPARISON:  05/24/2010 FINDINGS: Cardiac shadow is within normal limits. Patchy airspace opacities are noted primarily in the bases consistent with the given clinical history. No acute bony abnormality is noted. Postsurgical changes in the left clavicle are noted. IMPRESSION: Patchy airspace opacities consistent with the given clinical history of COVID-19 positivity. Electronically Signed   By: Alcide CleverMark  Lukens M.D.   On: 11/19/2019 01:18        Scheduled Meds: . albuterol  2  puff Inhalation Q6H  . vitamin C  500 mg Oral Daily  . azithromycin  250 mg Oral Daily  . cholecalciferol  1,000 Units Oral Daily  . enoxaparin (LOVENOX) injection  40 mg Subcutaneous Q24H  . folic acid  1 mg Oral Daily  . insulin aspart  0-9 Units Subcutaneous TID WC  . ipratropium  2 puff Inhalation Q8H  . methylPREDNISolone (SOLU-MEDROL) injection  40 mg Intravenous Q6H   Followed by  . [START ON 11/22/2019] methylPREDNISolone (SOLU-MEDROL) injection  40 mg Intravenous Q12H  . multivitamin with minerals  1 tablet Oral Daily  . pantoprazole  40 mg Oral Daily  . thiamine  100 mg Oral Daily  . zinc sulfate  220 mg Oral Daily   Continuous Infusions: . remdesivir 100 mg in NS 100 mL 100 mg (11/20/19 0853)     LOS: 1 day    Time spent: over 30 min    Lacretia Nicks, MD [facility]   To contact the attending provider between 7A-7P or the covering provider during after hours 7P-7A, please log into the web site [email or web address] and access using universal [place] password for that web site. If you do not have the password, please call the hospital operator.  11/20/2019, 9:30 AM

## 2019-11-21 LAB — CBC WITH DIFFERENTIAL/PLATELET
Abs Immature Granulocytes: 0.2 10*3/uL — ABNORMAL HIGH (ref 0.00–0.07)
Basophils Absolute: 0 10*3/uL (ref 0.0–0.1)
Basophils Relative: 0 %
Eosinophils Absolute: 0 10*3/uL (ref 0.0–0.5)
Eosinophils Relative: 0 %
HCT: 47.4 % (ref 39.0–52.0)
Hemoglobin: 16.3 g/dL (ref 13.0–17.0)
Immature Granulocytes: 2 %
Lymphocytes Relative: 12 %
Lymphs Abs: 1.3 10*3/uL (ref 0.7–4.0)
MCH: 31.2 pg (ref 26.0–34.0)
MCHC: 34.4 g/dL (ref 30.0–36.0)
MCV: 90.6 fL (ref 80.0–100.0)
Monocytes Absolute: 0.4 10*3/uL (ref 0.1–1.0)
Monocytes Relative: 4 %
Neutro Abs: 8.5 10*3/uL — ABNORMAL HIGH (ref 1.7–7.7)
Neutrophils Relative %: 82 %
Platelets: 357 10*3/uL (ref 150–400)
RBC: 5.23 MIL/uL (ref 4.22–5.81)
RDW: 11.9 % (ref 11.5–15.5)
WBC: 10.4 10*3/uL (ref 4.0–10.5)
nRBC: 0 % (ref 0.0–0.2)

## 2019-11-21 LAB — FERRITIN: Ferritin: 533 ng/mL — ABNORMAL HIGH (ref 24–336)

## 2019-11-21 LAB — GLUCOSE, CAPILLARY
Glucose-Capillary: 176 mg/dL — ABNORMAL HIGH (ref 70–99)
Glucose-Capillary: 256 mg/dL — ABNORMAL HIGH (ref 70–99)
Glucose-Capillary: 272 mg/dL — ABNORMAL HIGH (ref 70–99)
Glucose-Capillary: 245 mg/dL — ABNORMAL HIGH (ref 70–99)

## 2019-11-21 LAB — C-REACTIVE PROTEIN: CRP: 3.5 mg/dL — ABNORMAL HIGH (ref <1.0)

## 2019-11-21 LAB — COMPREHENSIVE METABOLIC PANEL
ALT: 49 U/L — ABNORMAL HIGH (ref 0–44)
AST: 23 U/L (ref 15–41)
Albumin: 3 g/dL — ABNORMAL LOW (ref 3.5–5.0)
Alkaline Phosphatase: 77 U/L (ref 38–126)
Anion gap: 12 (ref 5–15)
BUN: 16 mg/dL (ref 6–20)
CO2: 27 mmol/L (ref 22–32)
Calcium: 8.7 mg/dL — ABNORMAL LOW (ref 8.9–10.3)
Chloride: 100 mmol/L (ref 98–111)
Creatinine, Ser: 0.73 mg/dL (ref 0.61–1.24)
GFR calc Af Amer: >60 mL/min (ref >60)
GFR calc non Af Amer: >60 mL/min (ref >60)
Glucose, Bld: 180 mg/dL — ABNORMAL HIGH (ref 70–99)
Potassium: 3.7 mmol/L (ref 3.5–5.1)
Sodium: 139 mmol/L (ref 135–145)
Total Bilirubin: 0.8 mg/dL (ref 0.3–1.2)
Total Protein: 6.7 g/dL (ref 6.5–8.1)

## 2019-11-21 LAB — PHOSPHORUS: Phosphorus: 3.9 mg/dL (ref 2.5–4.6)

## 2019-11-21 LAB — PROCALCITONIN: Procalcitonin: <0.1 ng/mL

## 2019-11-21 LAB — D-DIMER, QUANTITATIVE: D-Dimer, Quant: 0.62 ug/mL-FEU — ABNORMAL HIGH (ref 0.00–0.50)

## 2019-11-21 LAB — BRAIN NATRIURETIC PEPTIDE: B Natriuretic Peptide: 59.8 pg/mL (ref 0.0–100.0)

## 2019-11-21 LAB — MAGNESIUM: Magnesium: 2.3 mg/dL (ref 1.7–2.4)

## 2019-11-21 MED ORDER — SALINE SPRAY 0.65 % NA SOLN
1.0000 | NASAL | Status: DC | PRN
  Administered 2019-11-21: 1 via NASAL
  Filled 2019-11-21: qty 44

## 2019-11-21 MED ORDER — ACETAMINOPHEN 325 MG PO TABS
650.0000 mg | ORAL_TABLET | Freq: Four times a day (QID) | ORAL | Status: DC | PRN
  Administered 2019-11-21: 650 mg via ORAL
  Filled 2019-11-21: qty 2

## 2019-11-21 MED ORDER — HYDROCODONE-ACETAMINOPHEN 5-325 MG PO TABS: 1.0000 | ORAL_TABLET | Freq: Four times a day (QID) | ORAL | Status: DC | PRN

## 2019-11-21 MED ORDER — INSULIN DETEMIR 100 UNIT/ML ~~LOC~~ SOLN
4.0000 [IU] | Freq: Two times a day (BID) | SUBCUTANEOUS | Status: DC
  Administered 2019-11-21 – 2019-11-25 (×9): 4 [IU] via SUBCUTANEOUS
  Filled 2019-11-21 (×10): qty 0.04

## 2019-11-21 MED ORDER — INSULIN ASPART 100 UNIT/ML ~~LOC~~ SOLN
0.0000 [IU] | Freq: Three times a day (TID) | SUBCUTANEOUS | Status: DC
  Administered 2019-11-21 – 2019-11-22 (×2): 8 [IU] via SUBCUTANEOUS
  Administered 2019-11-22: 5 [IU] via SUBCUTANEOUS
  Administered 2019-11-23 (×2): 3 [IU] via SUBCUTANEOUS
  Administered 2019-11-23 – 2019-11-24 (×2): 5 [IU] via SUBCUTANEOUS
  Administered 2019-11-24: 3 [IU] via SUBCUTANEOUS
  Administered 2019-11-24: 5 [IU] via SUBCUTANEOUS
  Administered 2019-11-25: 8 [IU] via SUBCUTANEOUS

## 2019-11-21 NOTE — Progress Notes (Addendum)
PROGRESS NOTE    Victor Gonzalez  WYO:[id number] DOB: 04-24-1985 DOA: 11/18/2019 PCP: Patient, No Pcp Per   Chief Complaint  Patient presents with  . COVID symptoms    Brief Narrative:  Victor Gonzalez is Victor Gonzalez 34 y.o. male with medical history significant for none prior who presents to [facility] ED from home with complaints of shortness of breath of 8 days duration.  Associated with Victor Gonzalez productive cough of white sputum, sore throat, subjective fever, pleuritic pain worse when he coughs, loss of sense of smell and taste.  He denies abdominal pain, nausea, or vomiting, admits to diarrhea.  He has not tested previously and has not received monoclonal antibodies.  He initially declined remdesivir and baricitinib.  After further discussion, he's now agreeable to remdesivir.  Assessment & Plan:   Active Problems:   Pneumonia due to COVID-19 virus   COVID-19 virus infection  Acute Hypoxic Respiratory Failure 2/2 COVID-19 viral pneumonia Unvaccinated   Presented with shortness of breath of 8 days duration associated with Victor Gonzalez productive cough with white sputum. COVID-19 screening test positive for 11-18-19 CXR 9/24 with patchy airspace opacities c/w covid 19 positivity Oxygen needs increasing, on 12 L this AM Continue high dose steroids, remedesivir, baricitinib.  Discussed risks/benefits. Vitamin C, D3 and zinc Strict I/O, daily weights Prone as able, OOB, IS, flutter, therapy  COVID-19 Labs  Recent Labs    11/19/19 0620 11/20/19 0131 11/21/19 0306  DDIMER 0.44 0.28 0.62*  FERRITIN 386* 445* 533*  CRP 9.2* 10.3* 3.5*    Lab Results  Component Value Date   SARSCOV2NAA POSITIVE (Victor Gonzalez) 11/18/2019   Leukocytosis  Continue to monitor, improved today procal improved  Hypokalemia likely from GI losses with diarrhea improved  Diarrhea, likely secondary to COVID-19 viral infection Monitor for now Encourage to increase oral intake to avoid dehydration  Prediabetes   Steroid Induced Hyperglycemia Follow A1c, SSI  DVT prophylaxis: lovenox Code Status: full  Family Communication: none at bedside - called brother's phone, no answer, msg left with interpreter Disposition:   Status is: Inpatient  Remains inpatient appropriate because:Inpatient level of care appropriate due to severity of illness   Dispo:  Patient From: Home  Planned Disposition: Home  Expected discharge date: 11/21/19  Medically stable for discharge: No   Consultants:   none  Procedures:   none  Antimicrobials:  Anti-infectives (From admission, onward)   Start     Dose/Rate Route Frequency Ordered Stop   11/20/19 1000  remdesivir 100 mg in sodium chloride 0.9 % 100 mL IVPB  Status:  Discontinued       "Followed by" Linked Group Details   100 mg 200 mL/hr over 30 Minutes Intravenous Daily 11/19/19 0515 11/19/19 0606   11/20/19 1000  azithromycin (ZITHROMAX) tablet 250 mg  Status:  Discontinued       "Followed by" Linked Group Details   250 mg Oral Daily 11/19/19 0632 11/20/19 0933   11/20/19 1000  remdesivir 100 mg in sodium chloride 0.9 % 100 mL IVPB       "Followed by" Linked Group Details   100 mg 200 mL/hr over 30 Minutes Intravenous Daily 11/19/19 0845 11/24/19 0959   11/19/19 1000  azithromycin (ZITHROMAX) tablet 500 mg       "Followed by" Linked Group Details   500 mg Oral Daily 11/19/19 0632 11/19/19 0818   11/19/19 1000  remdesivir 200 mg in sodium chloride 0.9% 250 mL IVPB       "Followed by" Linked Group Details  200 mg 580 mL/hr over 30 Minutes Intravenous Once 11/19/19 0845 11/19/19 1228   11/19/19 0600  remdesivir 200 mg in sodium chloride 0.9% 250 mL IVPB  Status:  Discontinued       "Followed by" Linked Group Details   200 mg 580 mL/hr over 30 Minutes Intravenous Once 11/19/19 0515 11/19/19 0606     Subjective: Interpreter used Feeling Victor Gonzalez little better Asking me how he's doing Asks me to call his brother Still issues with cough and  SOB  Objective: Vitals:   11/21/19 0000 11/21/19 0400 11/21/19 0737 11/21/19 0900  BP: 120/74 119/89 120/86   Pulse: (!) 58 78 73 78  Resp: 20 20 18    Temp: 98.6 F (37 C) 98.8 F (37.1 C) 98.9 F (37.2 C)   TempSrc: Oral Oral Oral   SpO2: 93% (!) 89% 90% 91%  Weight:      Height:        Intake/Output Summary (Last 24 hours) at 11/21/2019 1035 Last data filed at 11/21/2019 0839 Gross per 24 hour  Intake 240 ml  Output 875 ml  Net -635 ml   Filed Weights   11/19/19 0700 11/19/19 1000 11/20/19 0336  Weight: 86.2 kg 86.2 kg 93.7 kg    Examination:  General: No acute distress. Cardiovascular: Heart sounds show Victor Gonzalez regular rate, and rhythm.  Lungs: diminished, no clear adventitious lung sounds Abdomen: Soft, nontender, nondistended  Neurological: Alert and oriented 3. Moves all extremities 4. Cranial nerves II through XII grossly intact. Skin: Warm and dry. No rashes or lesions. Extremities: No clubbing or cyanosis. No edema.   Data Reviewed: I have personally reviewed following labs and imaging studies  CBC: Recent Labs  Lab 11/18/19 2219 11/19/19 0620 11/20/19 0131 11/21/19 0306  WBC 11.7* 9.6 9.8 10.4  NEUTROABS  --  8.5* 8.5* 8.5*  HGB 16.4 15.8 15.7 16.3  HCT 47.9 46.4 46.8 47.4  MCV 90.9 92.2 91.9 90.6  PLT 297 290 306 357    Basic Metabolic Panel: Recent Labs  Lab 11/18/19 2219 11/19/19 0620 11/20/19 0131 11/21/19 0306  NA 139 139 144 139  K 3.3* 4.1 4.0 3.7  CL 103 100 104 100  CO2 22 26 28 27   GLUCOSE 131* 164* 159* 180*  BUN 13 13 16 16   CREATININE 0.75 0.80 0.77 0.73  CALCIUM 8.5* 8.6* 8.9 8.7*  MG  --  2.1 2.5* 2.3  PHOS  --  3.6 3.5 3.9    GFR: Estimated Creatinine Clearance: 139.5 mL/min (by C-G formula based on SCr of 0.73 mg/dL).  Liver Function Tests: Recent Labs  Lab 11/18/19 2219 11/19/19 0620 11/20/19 0131 11/21/19 0306  AST 46* 40 31 23  ALT 56* 47* 57* 49*  ALKPHOS 89 74 74 77  BILITOT 0.9 1.1 1.0 0.8  PROT 7.4  7.1 7.3 6.7  ALBUMIN 3.6 3.4* 3.2* 3.0*    CBG: Recent Labs  Lab 11/20/19 0728 11/20/19 1200 11/20/19 1620 11/20/19 2202 11/21/19 0741  GLUCAP 192* 185* 208* 179* 176*     Recent Results (from the past 240 hour(s))  Respiratory Panel by RT PCR (Flu Victor Kerschner&B, Covid) - Nasopharyngeal Swab     Status: Abnormal   Collection Time: 11/18/19 10:30 PM   Specimen: Nasopharyngeal Swab  Result Value Ref Range Status   SARS Coronavirus 2 by RT PCR POSITIVE (Madason Rauls) NEGATIVE Final    Comment: RESULT CALLED TO, READ BACK BY AND VERIFIED WITH: J,GLOSTER @2330  11/18/19 EB (NOTE) SARS-CoV-2 target nucleic acids are DETECTED.  SARS-CoV-2 RNA is generally detectable in upper respiratory specimens  during the acute phase of infection. Positive results are indicative of the presence of the identified virus, but do not rule out bacterial infection or co-infection with other pathogens not detected by the test. Clinical correlation with patient history and other diagnostic information is necessary to determine patient infection status. The expected result is Negative.  Fact Sheet for Patients:  https://www.moore.com/https://www.fda.gov/media/142436/download  Fact Sheet for Healthcare Providers: https://www.young.biz/https://www.fda.gov/media/142435/download  This test is not yet approved or cleared by the [place] FDA and  has been authorized for detection and/or diagnosis of SARS-CoV-2 by FDA under an Emergency Use Authorization (EUA).  This EUA will remain in effect (meaning this test can be used) fo r the duration of  the COVID-19 declaration under Section 564(b)(1) of the Act, 21 U.S.C. section 360bbb-3(b)(1), unless the authorization is terminated or revoked sooner.      Influenza Meri Pelot by PCR NEGATIVE NEGATIVE Final   Influenza B by PCR NEGATIVE NEGATIVE Final    Comment: (NOTE) The Xpert Xpress SARS-CoV-2/FLU/RSV assay is intended as an aid in  the diagnosis of influenza from Nasopharyngeal swab specimens and  should not be used  as Quintasha Gren sole basis for treatment. Nasal washings and  aspirates are unacceptable for Xpert Xpress SARS-CoV-2/FLU/RSV  testing.  Fact Sheet for Patients: https://www.moore.com/https://www.fda.gov/media/142436/download  Fact Sheet for Healthcare Providers: https://www.young.biz/https://www.fda.gov/media/142435/download  This test is not yet approved or cleared by the [place] FDA and  has been authorized for detection and/or diagnosis of SARS-CoV-2 by  FDA under an Emergency Use Authorization (EUA). This EUA will remain  in effect (meaning this test can be used) for the duration of the  Covid-19 declaration under Section 564(b)(1) of the Act, 21  U.S.C. section 360bbb-3(b)(1), unless the authorization is  terminated or revoked. Performed at [facility], [address] [place], [place] [address]   MRSA PCR Screening     Status: None   Collection Time: 11/19/19  6:38 AM   Specimen: Nasopharyngeal  Result Value Ref Range Status   MRSA by PCR NEGATIVE NEGATIVE Final    Comment:        The GeneXpert MRSA Assay (FDA approved for NASAL specimens only), is one component of Kashia Brossard comprehensive MRSA colonization surveillance program. It is not intended to diagnose MRSA infection nor to guide or monitor treatment for MRSA infections. Performed at [facility], [address] [place], [place] [address]          Radiology Studies: No results found.      Scheduled Meds: . albuterol  2 puff Inhalation Q6H  . vitamin C  500 mg Oral Daily  . baricitinib  4 mg Oral Daily  . cholecalciferol  1,000 Units Oral Daily  . enoxaparin (LOVENOX) injection  40 mg Subcutaneous Q24H  . folic acid  1 mg Oral Daily  . insulin aspart  0-9 Units Subcutaneous TID WC  . ipratropium  2 puff Inhalation Q8H  . methylPREDNISolone (SOLU-MEDROL) injection  40 mg Intravenous Q6H   Followed by  . [START ON 11/22/2019] methylPREDNISolone (SOLU-MEDROL) injection  40 mg Intravenous Q12H  . multivitamin with minerals  1 tablet Oral  Daily  . pantoprazole  40 mg Oral Daily  . thiamine  100 mg Oral Daily  . zinc sulfate  220 mg Oral Daily   Continuous Infusions: . remdesivir 100 mg in NS 100 mL 100 mg (11/21/19 0835)     LOS: 2 days  Time spent: over 30 min    Lacretia Nicks, MD [facility]   To contact the attending provider between 7A-7P or the covering provider during after hours 7P-7A, please log into the web site [email or web address] and access using universal [place] password for that web site. If you do not have the password, please call the hospital operator.  11/21/2019, 10:35 AM

## 2019-11-22 LAB — D-DIMER, QUANTITATIVE: D-Dimer, Quant: 0.73 ug/mL-FEU — ABNORMAL HIGH (ref 0.00–0.50)

## 2019-11-22 LAB — COMPREHENSIVE METABOLIC PANEL
ALT: 49 U/L — ABNORMAL HIGH (ref 0–44)
AST: 21 U/L (ref 15–41)
Albumin: 2.9 g/dL — ABNORMAL LOW (ref 3.5–5.0)
Alkaline Phosphatase: 70 U/L (ref 38–126)
Anion gap: 6 (ref 5–15)
BUN: 19 mg/dL (ref 6–20)
CO2: 29 mmol/L (ref 22–32)
Calcium: 8.6 mg/dL — ABNORMAL LOW (ref 8.9–10.3)
Chloride: 104 mmol/L (ref 98–111)
Creatinine, Ser: 0.75 mg/dL (ref 0.61–1.24)
GFR calc Af Amer: >60 mL/min (ref >60)
GFR calc non Af Amer: >60 mL/min (ref >60)
Glucose, Bld: 181 mg/dL — ABNORMAL HIGH (ref 70–99)
Potassium: 3.8 mmol/L (ref 3.5–5.1)
Sodium: 139 mmol/L (ref 135–145)
Total Bilirubin: 0.6 mg/dL (ref 0.3–1.2)
Total Protein: 6 g/dL — ABNORMAL LOW (ref 6.5–8.1)

## 2019-11-22 LAB — CBC WITH DIFFERENTIAL/PLATELET
Abs Immature Granulocytes: 0.42 10*3/uL — ABNORMAL HIGH (ref 0.00–0.07)
Basophils Absolute: 0.1 10*3/uL (ref 0.0–0.1)
Basophils Relative: 1 %
Eosinophils Absolute: 0 10*3/uL (ref 0.0–0.5)
Eosinophils Relative: 0 %
HCT: 45 % (ref 39.0–52.0)
Hemoglobin: 15.6 g/dL (ref 13.0–17.0)
Immature Granulocytes: 4 %
Lymphocytes Relative: 9 %
Lymphs Abs: 1 10*3/uL (ref 0.7–4.0)
MCH: 31.3 pg (ref 26.0–34.0)
MCHC: 34.7 g/dL (ref 30.0–36.0)
MCV: 90.4 fL (ref 80.0–100.0)
Monocytes Absolute: 0.8 10*3/uL (ref 0.1–1.0)
Monocytes Relative: 8 %
Neutro Abs: 8.1 10*3/uL — ABNORMAL HIGH (ref 1.7–7.7)
Neutrophils Relative %: 78 %
Platelets: 374 10*3/uL (ref 150–400)
RBC: 4.98 MIL/uL (ref 4.22–5.81)
RDW: 11.8 % (ref 11.5–15.5)
WBC: 10.4 10*3/uL (ref 4.0–10.5)
nRBC: 0 % (ref 0.0–0.2)

## 2019-11-22 LAB — C-REACTIVE PROTEIN: CRP: 1.2 mg/dL — ABNORMAL HIGH (ref <1.0)

## 2019-11-22 LAB — FERRITIN: Ferritin: 398 ng/mL — ABNORMAL HIGH (ref 24–336)

## 2019-11-22 LAB — GLUCOSE, CAPILLARY
Glucose-Capillary: 114 mg/dL — ABNORMAL HIGH (ref 70–99)
Glucose-Capillary: 208 mg/dL — ABNORMAL HIGH (ref 70–99)
Glucose-Capillary: 285 mg/dL — ABNORMAL HIGH (ref 70–99)
Glucose-Capillary: 220 mg/dL — ABNORMAL HIGH (ref 70–99)

## 2019-11-22 LAB — MAGNESIUM: Magnesium: 2.3 mg/dL (ref 1.7–2.4)

## 2019-11-22 LAB — PHOSPHORUS: Phosphorus: 3.7 mg/dL (ref 2.5–4.6)

## 2019-11-22 MED ORDER — PSEUDOEPHEDRINE HCL ER 120 MG PO TB12
120.0000 mg | ORAL_TABLET | Freq: Two times a day (BID) | ORAL | Status: DC
  Administered 2019-11-22 – 2019-11-25 (×7): 120 mg via ORAL
  Filled 2019-11-22 (×8): qty 1

## 2019-11-22 MED ORDER — OXYMETAZOLINE HCL 0.05 % NA SOLN
1.0000 | Freq: Two times a day (BID) | NASAL | Status: AC
  Administered 2019-11-22 – 2019-11-24 (×6): 1 via NASAL
  Filled 2019-11-22: qty 30

## 2019-11-22 NOTE — Progress Notes (Addendum)
PROGRESS NOTE    Victor Gonzalez  XKG:[id number] DOB: Jul 12, 1985 DOA: 11/18/2019 PCP: Patient, No Pcp Per   Chief Complaint  Patient presents with  . COVID symptoms    Brief Narrative:  Victor Gonzalez is Victor Gonzalez 34 y.o. male with medical history significant for none prior who presents to [facility] ED from home with complaints of shortness of breath of 8 days duration.  Associated with Victor Gonzalez productive cough of white sputum, sore throat, subjective fever, pleuritic pain worse when he coughs, loss of sense of smell and taste.  He denies abdominal pain, nausea, or vomiting, admits to diarrhea.  He has not tested previously and has not received monoclonal antibodies.  He initially declined remdesivir and baricitinib.  After further discussion, he's now agreeable to remdesivir.  Assessment & Plan:   Active Problems:   Pneumonia due to COVID-19 virus   COVID-19 virus infection  Acute Hypoxic Respiratory Failure 2/2 COVID-19 viral pneumonia Unvaccinated   Presented with shortness of breath of 8 days duration associated with Victor Gonzalez productive cough with white sputum. COVID-19 screening test positive for 11-18-19 CXR 9/24 with patchy airspace opacities c/w covid 19 positivity Oxygen needs increasing, on 13 L this AM Continue high dose steroids, remedesivir (9/24-present), baricitinib (9/25-present).  Discussed risks/benefits. Vitamin C, D3 and zinc Strict I/O, daily weights Prone as able, OOB, IS, flutter, therapy  COVID-19 Labs  Recent Labs    11/20/19 0131 11/21/19 0306 11/22/19 0352  DDIMER 0.28 0.62* 0.73*  FERRITIN 445* 533* 398*  CRP 10.3* 3.5* 1.2*    Lab Results  Component Value Date   SARSCOV2NAA POSITIVE (Victor Gonzalez) 11/18/2019   Leukocytosis  Improved, follow  Hypokalemia likely from GI losses with diarrhea improved  Diarrhea, likely secondary to COVID-19 viral infection Monitor for now Encourage to increase oral intake to avoid dehydration  Prediabetes  Steroid  Induced Hyperglycemia Follow A1c (5.8), SSI and basal insulin, adjust prn   DVT prophylaxis: lovenox Code Status: full  Family Communication: 9/27, brother, interpreter used Disposition:   Status is: Inpatient  Remains inpatient appropriate because:Inpatient level of care appropriate due to severity of illness   Dispo:  Patient From: Home  Planned Disposition: Home  Expected discharge date: 11/25/19  Medically stable for discharge: No   Consultants:   none  Procedures:   none  Antimicrobials:  Anti-infectives (From admission, onward)   Start     Dose/Rate Route Frequency Ordered Stop   11/20/19 1000  remdesivir 100 mg in sodium chloride 0.9 % 100 mL IVPB  Status:  Discontinued       "Followed by" Linked Group Details   100 mg 200 mL/hr over 30 Minutes Intravenous Daily 11/19/19 0515 11/19/19 0606   11/20/19 1000  azithromycin (ZITHROMAX) tablet 250 mg  Status:  Discontinued       "Followed by" Linked Group Details   250 mg Oral Daily 11/19/19 0632 11/20/19 0933   11/20/19 1000  remdesivir 100 mg in sodium chloride 0.9 % 100 mL IVPB       "Followed by" Linked Group Details   100 mg 200 mL/hr over 30 Minutes Intravenous Daily 11/19/19 0845 11/24/19 0959   11/19/19 1000  azithromycin (ZITHROMAX) tablet 500 mg       "Followed by" Linked Group Details   500 mg Oral Daily 11/19/19 0632 11/19/19 0818   11/19/19 1000  remdesivir 200 mg in sodium chloride 0.9% 250 mL IVPB       "Followed by" Linked Group Details   200 mg 580 mL/hr  over 30 Minutes Intravenous Once 11/19/19 0845 11/19/19 1228   11/19/19 0600  remdesivir 200 mg in sodium chloride 0.9% 250 mL IVPB  Status:  Discontinued       "Followed by" Linked Group Details   200 mg 580 mL/hr over 30 Minutes Intravenous Once 11/19/19 0515 11/19/19 0606     Subjective: Asks me how I think he's doing, wants an update from the doctor (me) on how he's doing We discussed his clinical course Interpreter  used  Objective: Vitals:   11/22/19 0710 11/22/19 0734 11/22/19 1200 11/22/19 1600  BP:  127/89 112/75 122/80  Pulse:  77 75 80  Resp: 20 20 20 20   Temp:  98.2 F (36.8 C) 98.3 F (36.8 C) 98.4 F (36.9 C)  TempSrc:  Oral Oral Oral  SpO2:  91% 90% 93%  Weight:      Height:        Intake/Output Summary (Last 24 hours) at 11/22/2019 1611 Last data filed at 11/22/2019 1601 Gross per 24 hour  Intake --  Output 1450 ml  Net -1450 ml   Filed Weights   11/19/19 0700 11/19/19 1000 11/20/19 0336  Weight: 86.2 kg 86.2 kg 93.7 kg    Examination:  General: No acute distress. Sitting upright in chair, had been washing himself prior to my interview. Cardiovascular: RRR, no mgr Lungs: diminished, faint crackles Abdomen: s/nt/nd Neurological: Victor Gonzalez&Ox3, no focal deficits Skin: warm and dry Extremities: no LEE  Data Reviewed: I have personally reviewed following labs and imaging studies  CBC: Recent Labs  Lab 11/18/19 2219 11/19/19 0620 11/20/19 0131 11/21/19 0306 11/22/19 0352  WBC 11.7* 9.6 9.8 10.4 10.4  NEUTROABS  --  8.5* 8.5* 8.5* 8.1*  HGB 16.4 15.8 15.7 16.3 15.6  HCT 47.9 46.4 46.8 47.4 45.0  MCV 90.9 92.2 91.9 90.6 90.4  PLT 297 290 306 357 374    Basic Metabolic Panel: Recent Labs  Lab 11/18/19 2219 11/19/19 0620 11/20/19 0131 11/21/19 0306 11/22/19 0352  NA 139 139 144 139 139  K 3.3* 4.1 4.0 3.7 3.8  CL 103 100 104 100 104  CO2 22 26 28 27 29   GLUCOSE 131* 164* 159* 180* 181*  BUN 13 13 16 16 19   CREATININE 0.75 0.80 0.77 0.73 0.75  CALCIUM 8.5* 8.6* 8.9 8.7* 8.6*  MG  --  2.1 2.5* 2.3 2.3  PHOS  --  3.6 3.5 3.9 3.7    GFR: Estimated Creatinine Clearance: 139.5 mL/min (by C-G formula based on SCr of 0.75 mg/dL).  Liver Function Tests: Recent Labs  Lab 11/18/19 2219 11/19/19 0620 11/20/19 0131 11/21/19 0306 11/22/19 0352  AST 46* 40 31 23 21   ALT 56* 47* 57* 49* 49*  ALKPHOS 89 74 74 77 70  BILITOT 0.9 1.1 1.0 0.8 0.6  PROT 7.4 7.1 7.3  6.7 6.0*  ALBUMIN 3.6 3.4* 3.2* 3.0* 2.9*    CBG: Recent Labs  Lab 11/21/19 1549 11/21/19 2105 11/22/19 0731 11/22/19 1158 11/22/19 1558  GLUCAP 272* 245* 114* 208* 285*     Recent Results (from the past 240 hour(s))  Respiratory Panel by RT PCR (Flu Hoa Briggs&B, Covid) - Nasopharyngeal Swab     Status: Abnormal   Collection Time: 11/18/19 10:30 PM   Specimen: Nasopharyngeal Swab  Result Value Ref Range Status   SARS Coronavirus 2 by RT PCR POSITIVE (Johndavid Geralds) NEGATIVE Final    Comment: RESULT CALLED TO, READ BACK BY AND VERIFIED WITH: J,GLOSTER @2330  11/18/19 EB (NOTE) SARS-CoV-2 target nucleic  acids are DETECTED.  SARS-CoV-2 RNA is generally detectable in upper respiratory specimens  during the acute phase of infection. Positive results are indicative of the presence of the identified virus, but do not rule out bacterial infection or co-infection with other pathogens not detected by the test. Clinical correlation with patient history and other diagnostic information is necessary to determine patient infection status. The expected result is Negative.  Fact Sheet for Patients:  https://www.moore.com/https://www.fda.gov/media/142436/download  Fact Sheet for Healthcare Providers: https://www.young.biz/https://www.fda.gov/media/142435/download  This test is not yet approved or cleared by the [place] FDA and  has been authorized for detection and/or diagnosis of SARS-CoV-2 by FDA under an Emergency Use Authorization (EUA).  This EUA will remain in effect (meaning this test can be used) fo r the duration of  the COVID-19 declaration under Section 564(b)(1) of the Act, 21 U.S.C. section 360bbb-3(b)(1), unless the authorization is terminated or revoked sooner.      Influenza Samnang Shugars by PCR NEGATIVE NEGATIVE Final   Influenza B by PCR NEGATIVE NEGATIVE Final    Comment: (NOTE) The Xpert Xpress SARS-CoV-2/FLU/RSV assay is intended as an aid in  the diagnosis of influenza from Nasopharyngeal swab specimens and  should not be used  as Gabriellah Rabel sole basis for treatment. Nasal washings and  aspirates are unacceptable for Xpert Xpress SARS-CoV-2/FLU/RSV  testing.  Fact Sheet for Patients: https://www.moore.com/https://www.fda.gov/media/142436/download  Fact Sheet for Healthcare Providers: https://www.young.biz/https://www.fda.gov/media/142435/download  This test is not yet approved or cleared by the [place] FDA and  has been authorized for detection and/or diagnosis of SARS-CoV-2 by  FDA under an Emergency Use Authorization (EUA). This EUA will remain  in effect (meaning this test can be used) for the duration of the  Covid-19 declaration under Section 564(b)(1) of the Act, 21  U.S.C. section 360bbb-3(b)(1), unless the authorization is  terminated or revoked. Performed at [facility], [address] [place], [place] [address]   MRSA PCR Screening     Status: None   Collection Time: 11/19/19  6:38 AM   Specimen: Nasopharyngeal  Result Value Ref Range Status   MRSA by PCR NEGATIVE NEGATIVE Final    Comment:        The GeneXpert MRSA Assay (FDA approved for NASAL specimens only), is one component of Naythen Heikkila comprehensive MRSA colonization surveillance program. It is not intended to diagnose MRSA infection nor to guide or monitor treatment for MRSA infections. Performed at [facility], [address] [place], [place] [address]          Radiology Studies: No results found.      Scheduled Meds: . albuterol  2 puff Inhalation Q6H  . vitamin C  500 mg Oral Daily  . baricitinib  4 mg Oral Daily  . cholecalciferol  1,000 Units Oral Daily  . enoxaparin (LOVENOX) injection  40 mg Subcutaneous Q24H  . folic acid  1 mg Oral Daily  . insulin aspart  0-15 Units Subcutaneous TID WC  . insulin detemir  4 Units Subcutaneous BID  . ipratropium  2 puff Inhalation Q8H  . methylPREDNISolone (SOLU-MEDROL) injection  40 mg Intravenous Q12H  . multivitamin with minerals  1 tablet Oral Daily  . oxymetazoline  1 spray Each Nare BID  .  pantoprazole  40 mg Oral Daily  . pseudoephedrine  120 mg Oral BID  . thiamine  100 mg Oral Daily  . zinc sulfate  220 mg Oral Daily   Continuous Infusions: . remdesivir 100 mg in NS 100 mL  100 mg (11/22/19 1011)     LOS: 3 days    Time spent: over 30 min    Lacretia Nicks, MD [facility]   To contact the attending provider between 7A-7P or the covering provider during after hours 7P-7A, please log into the web site [email or web address] and access using universal [place] password for that web site. If you do not have the password, please call the hospital operator.  11/22/2019, 4:11 PM

## 2019-11-23 LAB — COMPREHENSIVE METABOLIC PANEL
ALT: 57 U/L — ABNORMAL HIGH (ref 0–44)
AST: 22 U/L (ref 15–41)
Albumin: 2.9 g/dL — ABNORMAL LOW (ref 3.5–5.0)
Alkaline Phosphatase: 69 U/L (ref 38–126)
Anion gap: 10 (ref 5–15)
BUN: 16 mg/dL (ref 6–20)
CO2: 27 mmol/L (ref 22–32)
Calcium: 8.3 mg/dL — ABNORMAL LOW (ref 8.9–10.3)
Chloride: 101 mmol/L (ref 98–111)
Creatinine, Ser: 0.66 mg/dL (ref 0.61–1.24)
GFR calc Af Amer: >60 mL/min (ref >60)
GFR calc non Af Amer: >60 mL/min (ref >60)
Glucose, Bld: 154 mg/dL — ABNORMAL HIGH (ref 70–99)
Potassium: 3.8 mmol/L (ref 3.5–5.1)
Sodium: 138 mmol/L (ref 135–145)
Total Bilirubin: 0.9 mg/dL (ref 0.3–1.2)
Total Protein: 6.3 g/dL — ABNORMAL LOW (ref 6.5–8.1)

## 2019-11-23 LAB — CBC WITH DIFFERENTIAL/PLATELET
Abs Immature Granulocytes: 0.61 10*3/uL — ABNORMAL HIGH (ref 0.00–0.07)
Basophils Absolute: 0 10*3/uL (ref 0.0–0.1)
Basophils Relative: 1 %
Eosinophils Absolute: 0 10*3/uL (ref 0.0–0.5)
Eosinophils Relative: 0 %
HCT: 46.3 % (ref 39.0–52.0)
Hemoglobin: 15.8 g/dL (ref 13.0–17.0)
Immature Granulocytes: 7 %
Lymphocytes Relative: 9 %
Lymphs Abs: 0.7 10*3/uL (ref 0.7–4.0)
MCH: 30.4 pg (ref 26.0–34.0)
MCHC: 34.1 g/dL (ref 30.0–36.0)
MCV: 89 fL (ref 80.0–100.0)
Monocytes Absolute: 0.4 10*3/uL (ref 0.1–1.0)
Monocytes Relative: 5 %
Neutro Abs: 6.6 10*3/uL (ref 1.7–7.7)
Neutrophils Relative %: 78 %
Platelets: 359 10*3/uL (ref 150–400)
RBC: 5.2 MIL/uL (ref 4.22–5.81)
RDW: 11.8 % (ref 11.5–15.5)
WBC: 8.4 10*3/uL (ref 4.0–10.5)
nRBC: 0 % (ref 0.0–0.2)

## 2019-11-23 LAB — GLUCOSE, CAPILLARY
Glucose-Capillary: 152 mg/dL — ABNORMAL HIGH (ref 70–99)
Glucose-Capillary: 163 mg/dL — ABNORMAL HIGH (ref 70–99)
Glucose-Capillary: 210 mg/dL — ABNORMAL HIGH (ref 70–99)
Glucose-Capillary: 216 mg/dL — ABNORMAL HIGH (ref 70–99)

## 2019-11-23 LAB — D-DIMER, QUANTITATIVE: D-Dimer, Quant: 0.67 ug/mL-FEU — ABNORMAL HIGH (ref 0.00–0.50)

## 2019-11-23 LAB — FERRITIN: Ferritin: 408 ng/mL — ABNORMAL HIGH (ref 24–336)

## 2019-11-23 LAB — C-REACTIVE PROTEIN: CRP: 0.8 mg/dL (ref <1.0)

## 2019-11-23 LAB — MAGNESIUM: Magnesium: 2.2 mg/dL (ref 1.7–2.4)

## 2019-11-23 LAB — CK: Total CK: 104 U/L (ref 49–397)

## 2019-11-23 LAB — PHOSPHORUS: Phosphorus: 3.1 mg/dL (ref 2.5–4.6)

## 2019-11-23 MED ORDER — POTASSIUM CHLORIDE CRYS ER 20 MEQ PO TBCR
20.0000 meq | EXTENDED_RELEASE_TABLET | Freq: Once | ORAL | Status: AC
  Administered 2019-11-23: 20 meq via ORAL
  Filled 2019-11-23: qty 1

## 2019-11-23 NOTE — Progress Notes (Signed)
PROGRESS NOTE    Victor Gonzalez  EVO:[id number] DOB: 06-26-1985 DOA: 11/18/2019 PCP: Patient, No Pcp Per   Chief Complaint  Patient presents with  . COVID symptoms    Brief Narrative:  Victor Gonzalez is Victor Gonzalez 34 y.o. male with medical history significant for none prior who presents to [facility] ED from home with complaints of shortness of breath of 8 days duration.  Associated with Victor Gonzalez productive cough of white sputum, sore throat, subjective fever, pleuritic pain worse when he coughs, loss of sense of smell and taste.  He denies abdominal pain, nausea, or vomiting, admits to diarrhea.  He has not tested previously and has not received monoclonal antibodies.  He initially declined remdesivir and baricitinib.  After further discussion, he's now agreeable to remdesivir.  He was admitted for COVID 19 pneumonia.  He's gradually improving  Assessment & Plan:   Active Problems:   Pneumonia due to COVID-19 virus   COVID-19 virus infection  Acute Hypoxic Respiratory Failure 2/2 COVID-19 viral pneumonia Unvaccinated   Presented with shortness of breath of 8 days duration associated with Asheley Hellberg productive cough with white sputum. COVID-19 screening test positive for 11-18-19 CXR 9/24 with patchy airspace opacities c/w covid 19 positivity Oxygen needs increasing, on 6 L this AM, improving, may be ready for discharge in the next few days - expect he'll likely need to d/c with oxygen Continue high dose steroids, remedesivir (9/24-28), baricitinib (9/25-present).  Discussed risks/benefits. Vitamin C, D3 and zinc Strict I/O, daily weights Prone as able, OOB, IS, flutter, therapy  COVID-19 Labs  Recent Labs    11/21/19 0306 11/22/19 0352 11/23/19 0056  DDIMER 0.62* 0.73* 0.67*  FERRITIN 533* 398* 408*  CRP 3.5* 1.2* 0.8    Lab Results  Component Value Date   SARSCOV2NAA POSITIVE (Victor Gonzalez) 11/18/2019   Leukocytosis  Improved, follow  Hypokalemia likely from GI losses with  diarrhea improved  Diarrhea, likely secondary to COVID-19 viral infection Monitor for now Encourage to increase oral intake to avoid dehydration  Prediabetes  Steroid Induced Hyperglycemia Follow A1c (5.8), SSI and basal insulin, adjust prn   DVT prophylaxis: lovenox Code Status: full  Family Communication: 9/27, brother, interpreter used Disposition:   Status is: Inpatient  Remains inpatient appropriate because:Inpatient level of care appropriate due to severity of illness   Dispo:  Patient From: Home  Planned Disposition: Home  Expected discharge date: 11/25/19  Medically stable for discharge: No   Consultants:   none  Procedures:   none  Antimicrobials:  Anti-infectives (From admission, onward)   Start     Dose/Rate Route Frequency Ordered Stop   11/20/19 1000  remdesivir 100 mg in sodium chloride 0.9 % 100 mL IVPB  Status:  Discontinued       "Followed by" Linked Group Details   100 mg 200 mL/hr over 30 Minutes Intravenous Daily 11/19/19 0515 11/19/19 0606   11/20/19 1000  azithromycin (ZITHROMAX) tablet 250 mg  Status:  Discontinued       "Followed by" Linked Group Details   250 mg Oral Daily 11/19/19 0632 11/20/19 0933   11/20/19 1000  remdesivir 100 mg in sodium chloride 0.9 % 100 mL IVPB       "Followed by" Linked Group Details   100 mg 200 mL/hr over 30 Minutes Intravenous Daily 11/19/19 0845 11/23/19 0946   11/19/19 1000  azithromycin (ZITHROMAX) tablet 500 mg       "Followed by" Linked Group Details   500 mg Oral Daily 11/19/19 0632 11/19/19 0818  11/19/19 1000  remdesivir 200 mg in sodium chloride 0.9% 250 mL IVPB       "Followed by" Linked Group Details   200 mg 580 mL/hr over 30 Minutes Intravenous Once 11/19/19 0845 11/19/19 1228   11/19/19 0600  remdesivir 200 mg in sodium chloride 0.9% 250 mL IVPB  Status:  Discontinued       "Followed by" Linked Group Details   200 mg 580 mL/hr over 30 Minutes Intravenous Once 11/19/19 0515 11/19/19 0606      Subjective: No new complaints Asks me how he's doing Discussed with interpreter  Objective: Vitals:   11/23/19 0710 11/23/19 0722 11/23/19 1212 11/23/19 1629  BP:  112/80 125/84 116/82  Pulse:  62 79 65  Resp: 20 20 20 18   Temp:  98.1 F (36.7 C) 98.8 F (37.1 C) 97.7 F (36.5 C)  TempSrc:  Oral Oral Oral  SpO2:  94% 93% 93%  Weight:      Height:        Intake/Output Summary (Last 24 hours) at 11/23/2019 1737 Last data filed at 11/23/2019 1633 Gross per 24 hour  Intake --  Output 2075 ml  Net -2075 ml   Filed Weights   11/19/19 0700 11/19/19 1000 11/20/19 0336  Weight: 86.2 kg 86.2 kg 93.7 kg    Examination:  General: No acute distress. Cardiovascular: Heart sounds show [name] regular rate, and rhythm Lungs: Clear to auscultation bilaterally. Abdomen: Soft, nontender, nondistended  Neurological: Alert and oriented 3. Moves all extremities 4. Cranial nerves II through XII grossly intact. Skin: Warm and dry. No rashes or lesions. Extremities: No clubbing or cyanosis. No edema.   Data Reviewed: I have personally reviewed following labs and imaging studies  CBC: Recent Labs  Lab 11/19/19 0620 11/20/19 0131 11/21/19 0306 11/22/19 0352 11/23/19 0056  WBC 9.6 9.8 10.4 10.4 8.4  NEUTROABS 8.5* 8.5* 8.5* 8.1* 6.6  HGB 15.8 15.7 16.3 15.6 15.8  HCT 46.4 46.8 47.4 45.0 46.3  MCV 92.2 91.9 90.6 90.4 89.0  PLT 290 306 357 374 359    Basic Metabolic Panel: Recent Labs  Lab 11/19/19 0620 11/20/19 0131 11/21/19 0306 11/22/19 0352 11/23/19 0056  NA 139 144 139 139 138  K 4.1 4.0 3.7 3.8 3.8  CL 100 104 100 104 101  CO2 26 28 27 29 27   GLUCOSE 164* 159* 180* 181* 154*  BUN 13 16 16 19 16   CREATININE 0.80 0.77 0.73 0.75 0.66  CALCIUM 8.6* 8.9 8.7* 8.6* 8.3*  MG 2.1 2.5* 2.3 2.3 2.2  PHOS 3.6 3.5 3.9 3.7 3.1    GFR: Estimated Creatinine Clearance: 139.5 mL/min (by C-G formula based on SCr of 0.66 mg/dL).  Liver Function Tests: Recent Labs  Lab  11/19/19 0620 11/20/19 0131 11/21/19 0306 11/22/19 0352 11/23/19 0056  AST 40 31 23 21 22   ALT 47* 57* 49* 49* 57*  ALKPHOS 74 74 77 70 69  BILITOT 1.1 1.0 0.8 0.6 0.9  PROT 7.1 7.3 6.7 6.0* 6.3*  ALBUMIN 3.4* 3.2* 3.0* 2.9* 2.9*    CBG: Recent Labs  Lab 11/22/19 1558 11/22/19 2054 11/23/19 0724 11/23/19 1223 11/23/19 1631  GLUCAP 285* 220* 152* 163* 210*     Recent Results (from the past 240 hour(s))  Respiratory Panel by RT PCR (Flu Aniello Christopoulos&B, Covid) - Nasopharyngeal Swab     Status: Abnormal   Collection Time: 11/18/19 10:30 PM   Specimen: Nasopharyngeal Swab  Result Value Ref Range Status   SARS Coronavirus 2 by  RT PCR POSITIVE (Erice Ahles) NEGATIVE Final    Comment: RESULT CALLED TO, READ BACK BY AND VERIFIED WITH: J,GLOSTER @2330  11/18/19 EB (NOTE) SARS-CoV-2 target nucleic acids are DETECTED.  SARS-CoV-2 RNA is generally detectable in upper respiratory specimens  during the acute phase of infection. Positive results are indicative of the presence of the identified virus, but do not rule out bacterial infection or co-infection with other pathogens not detected by the test. Clinical correlation with patient history and other diagnostic information is necessary to determine patient infection status. The expected result is Negative.  Fact Sheet for Patients:  https://www.moore.com/https://www.fda.gov/media/142436/download  Fact Sheet for Healthcare Providers: https://www.young.biz/https://www.fda.gov/media/142435/download  This test is not yet approved or cleared by the [place] FDA and  has been authorized for detection and/or diagnosis of SARS-CoV-2 by FDA under an Emergency Use Authorization (EUA).  This EUA will remain in effect (meaning this test can be used) fo r the duration of  the COVID-19 declaration under Section 564(b)(1) of the Act, 21 U.S.C. section 360bbb-3(b)(1), unless the authorization is terminated or revoked sooner.      Influenza Giannah Zavadil by PCR NEGATIVE NEGATIVE Final   Influenza B by PCR  NEGATIVE NEGATIVE Final    Comment: (NOTE) The Xpert Xpress SARS-CoV-2/FLU/RSV assay is intended as an aid in  the diagnosis of influenza from Nasopharyngeal swab specimens and  should not be used as Nehemias Sauceda sole basis for treatment. Nasal washings and  aspirates are unacceptable for Xpert Xpress SARS-CoV-2/FLU/RSV  testing.  Fact Sheet for Patients: https://www.moore.com/https://www.fda.gov/media/142436/download  Fact Sheet for Healthcare Providers: https://www.young.biz/https://www.fda.gov/media/142435/download  This test is not yet approved or cleared by the [place] FDA and  has been authorized for detection and/or diagnosis of SARS-CoV-2 by  FDA under an Emergency Use Authorization (EUA). This EUA will remain  in effect (meaning this test can be used) for the duration of the  Covid-19 declaration under Section 564(b)(1) of the Act, 21  U.S.C. section 360bbb-3(b)(1), unless the authorization is  terminated or revoked. Performed at [facility], [address] [place], [place] [address]   MRSA PCR Screening     Status: None   Collection Time: 11/19/19  6:38 AM   Specimen: Nasopharyngeal  Result Value Ref Range Status   MRSA by PCR NEGATIVE NEGATIVE Final    Comment:        The GeneXpert MRSA Assay (FDA approved for NASAL specimens only), is one component of Morgyn Marut comprehensive MRSA colonization surveillance program. It is not intended to diagnose MRSA infection nor to guide or monitor treatment for MRSA infections. Performed at [facility], [address] [place], [place] [address]          Radiology Studies: No results found.      Scheduled Meds: . albuterol  2 puff Inhalation Q6H  . vitamin C  500 mg Oral Daily  . baricitinib  4 mg Oral Daily  . cholecalciferol  1,000 Units Oral Daily  . enoxaparin (LOVENOX) injection  40 mg Subcutaneous Q24H  . folic acid  1 mg Oral Daily  . insulin aspart  0-15 Units Subcutaneous TID WC  . insulin detemir  4 Units Subcutaneous BID  . ipratropium   2 puff Inhalation Q8H  . methylPREDNISolone (SOLU-MEDROL) injection  40 mg Intravenous Q12H  . multivitamin with minerals  1 tablet Oral Daily  . oxymetazoline  1 spray Each Nare BID  . pantoprazole  40 mg Oral Daily  . pseudoephedrine  120 mg Oral BID  .  thiamine  100 mg Oral Daily  . zinc sulfate  220 mg Oral Daily   Continuous Infusions:    LOS: 4 days    Time spent: over 30 min    Lacretia Nicks, MD [facility]   To contact the attending provider between 7A-7P or the covering provider during after hours 7P-7A, please log into the web site [email or web address] and access using universal [place] password for that web site. If you do not have the password, please call the hospital operator.  11/23/2019, 5:37 PM

## 2019-11-24 LAB — CBC WITH DIFFERENTIAL/PLATELET
Abs Immature Granulocytes: 0.93 10*3/uL — ABNORMAL HIGH (ref 0.00–0.07)
Basophils Absolute: 0 10*3/uL (ref 0.0–0.1)
Basophils Relative: 0 %
Eosinophils Absolute: 0 10*3/uL (ref 0.0–0.5)
Eosinophils Relative: 0 %
HCT: 52.2 % — ABNORMAL HIGH (ref 39.0–52.0)
Hemoglobin: 17.9 g/dL — ABNORMAL HIGH (ref 13.0–17.0)
Immature Granulocytes: 8 %
Lymphocytes Relative: 10 %
Lymphs Abs: 1.1 10*3/uL (ref 0.7–4.0)
MCH: 31.1 pg (ref 26.0–34.0)
MCHC: 34.3 g/dL (ref 30.0–36.0)
MCV: 90.8 fL (ref 80.0–100.0)
Monocytes Absolute: 0.6 10*3/uL (ref 0.1–1.0)
Monocytes Relative: 5 %
Neutro Abs: 8.5 10*3/uL — ABNORMAL HIGH (ref 1.7–7.7)
Neutrophils Relative %: 77 %
Platelets: 439 10*3/uL — ABNORMAL HIGH (ref 150–400)
RBC: 5.75 MIL/uL (ref 4.22–5.81)
RDW: 12 % (ref 11.5–15.5)
WBC: 11.1 10*3/uL — ABNORMAL HIGH (ref 4.0–10.5)
nRBC: 0 % (ref 0.0–0.2)

## 2019-11-24 LAB — COMPREHENSIVE METABOLIC PANEL
ALT: 52 U/L — ABNORMAL HIGH (ref 0–44)
AST: 18 U/L (ref 15–41)
Albumin: 3.2 g/dL — ABNORMAL LOW (ref 3.5–5.0)
Alkaline Phosphatase: 73 U/L (ref 38–126)
Anion gap: 11 (ref 5–15)
BUN: 17 mg/dL (ref 6–20)
CO2: 30 mmol/L (ref 22–32)
Calcium: 8.8 mg/dL — ABNORMAL LOW (ref 8.9–10.3)
Chloride: 97 mmol/L — ABNORMAL LOW (ref 98–111)
Creatinine, Ser: 0.85 mg/dL (ref 0.61–1.24)
GFR calc Af Amer: >60 mL/min (ref >60)
GFR calc non Af Amer: >60 mL/min (ref >60)
Glucose, Bld: 157 mg/dL — ABNORMAL HIGH (ref 70–99)
Potassium: 4.2 mmol/L (ref 3.5–5.1)
Sodium: 138 mmol/L (ref 135–145)
Total Bilirubin: 1 mg/dL (ref 0.3–1.2)
Total Protein: 6.7 g/dL (ref 6.5–8.1)

## 2019-11-24 LAB — C-REACTIVE PROTEIN: CRP: 0.5 mg/dL (ref <1.0)

## 2019-11-24 LAB — GLUCOSE, CAPILLARY
Glucose-Capillary: 153 mg/dL — ABNORMAL HIGH (ref 70–99)
Glucose-Capillary: 202 mg/dL — ABNORMAL HIGH (ref 70–99)
Glucose-Capillary: 225 mg/dL — ABNORMAL HIGH (ref 70–99)
Glucose-Capillary: 205 mg/dL — ABNORMAL HIGH (ref 70–99)

## 2019-11-24 LAB — D-DIMER, QUANTITATIVE: D-Dimer, Quant: 0.96 ug/mL-FEU — ABNORMAL HIGH (ref 0.00–0.50)

## 2019-11-24 LAB — BRAIN NATRIURETIC PEPTIDE: B Natriuretic Peptide: 29.8 pg/mL (ref 0.0–100.0)

## 2019-11-24 LAB — MAGNESIUM: Magnesium: 2.4 mg/dL (ref 1.7–2.4)

## 2019-11-24 MED ORDER — METHYLPREDNISOLONE SODIUM SUCC 40 MG IJ SOLR
40.0000 mg | Freq: Every day | INTRAMUSCULAR | Status: DC
  Administered 2019-11-25: 40 mg via INTRAVENOUS
  Filled 2019-11-24: qty 1

## 2019-11-24 NOTE — Evaluation (Signed)
Physical Therapy Evaluation & Discharge Patient Details Name: Victor Gonzalez MRN: [id number] DOB: 1985/09/18 Today's Date: 11/24/2019   History of Present Illness  Pt is a 34 y.o. male admitted 11/18/19 with -day h/o SOB, cough, fever; workup for acute respiratory failure secondary to COVID-19 viral PNA. No significant PMH; pt unvaccinated.    Clinical Impression  Patient evaluated by Physical Therapy with no further acute PT needs identified. PTA, pt independent, works and lives alone. Today, pt independent with mobility and ADL tasks. SpO2 86-90% on RA. All education has been completed and the patient has no further questions. Acute PT is signing off. Thank you for this referral.  SpO2 down to 86% on RA with ambulation, quick to return to 90% with seated rest; pt in no apparent distress SpO2 90% on 3L O2 [place] with activity   Follow Up Recommendations No PT follow up    Equipment Recommendations  None recommended by PT    Recommendations for Other Services       Precautions / Restrictions Restrictions Weight Bearing Restrictions: No      Mobility  Bed Mobility               General bed mobility comments: Received sitting at sink brushing teeth  Transfers Overall transfer level: Independent Equipment used: None                Ambulation/Gait Ambulation/Gait assistance: Independent Gait Distance (Feet): 180 Feet Assistive device: None Gait Pattern/deviations: WFL(Within Functional Limits)   Gait velocity interpretation: >2.62 ft/sec, indicative of community ambulatory General Gait Details: Multiple bouts of ambulation throughout room, independent and managing lines well; SpO2 94% on 6L, down to 86% on RA, quick return to 90% on RA with sitting rest  Stairs            Wheelchair Mobility    Modified Rankin (Stroke Patients Only)       Balance Overall balance assessment: No apparent balance deficits (not formally assessed)                                            Pertinent Vitals/Pain Pain Assessment: No/denies pain    Home Living Family/patient expects to be discharged to:: Private residence Living Arrangements: Alone Available Help at Discharge: Family;Friend(s);Available PRN/intermittently Type of Home: House Home Access: Stairs to enter   Entrance Stairs-Number of Steps: 1 Home Layout: One level Home Equipment: None      Prior Function Level of Independence: Independent         Comments: Works in [employer]        Extremity/Trunk Assessment   Upper Extremity Assessment Upper Extremity Assessment: Overall WFL for tasks assessed    Lower Extremity Assessment Lower Extremity Assessment: Overall WFL for tasks assessed    Cervical / Trunk Assessment Cervical / Trunk Assessment: Normal  Communication   Communication: Prefers language other than English;Interpreter utilized  Cognition Arousal/Alertness: Awake/alert Behavior During Therapy: WFL for tasks assessed/performed Overall Cognitive Status: Within Functional Limits for tasks assessed                                 General Comments: WFL via ipad Spanish interpreter      General Comments      Exercises     Assessment/Plan  PT Assessment Patent does not need any further PT services  PT Problem List         PT Treatment Interventions      PT Goals (Current goals can be found in the Care Plan section)  Acute Rehab PT Goals PT Goal Formulation: All assessment and education complete, DC therapy    Frequency     Barriers to discharge        Co-evaluation               AM-PAC PT "6 Clicks" Mobility  Outcome Measure Help needed turning from your back to your side while in a flat bed without using bedrails?: None Help needed moving from lying on your back to sitting on the side of a flat bed without using bedrails?: None Help needed moving to and from a bed to a  chair (including a wheelchair)?: None Help needed standing up from a chair using your arms (e.g., wheelchair or bedside chair)?: None Help needed to walk in hospital room?: None Help needed climbing 3-5 steps with a railing? : None 6 Click Score: 24    End of Session Equipment Utilized During Treatment: Oxygen Activity Tolerance: Patient tolerated treatment well Patient left: in chair;with call bell/phone within reach;Other (comment) (with lab services present) Nurse Communication: Mobility status PT Visit Diagnosis: Other abnormalities of gait and mobility (R26.89)    Time: 7262-0355 PT Time Calculation (min) (ACUTE ONLY): 21 min   Charges:   PT Evaluation $PT Eval Moderate Complexity: 1 Mod         Victor Gonzalez, PT, DPT Acute Rehabilitation Services  Pager [phone] Office [phone]  Victor Gonzalez 11/24/2019, 8:30 AM

## 2019-11-24 NOTE — Care Management (Signed)
Case manager called [employer] for oxygen needs and spoke to [place].  Letter of Guarantee LOG was submitted to Inetta Fermo and is pending approval.

## 2019-11-24 NOTE — TOC Transition Note (Signed)
Transition of Care [facility]) - CM/SW Discharge Note   Patient Details  Name: Victor Gonzalez MRN: [id number] Date of Birth: [birth year]  [facility] CM/SW Contact:  Nance Pear, RN Phone Number: 11/24/2019, 3:14 PM   Clinical Narrative:    Case manager noted need for home oxygen and update on COVID clinic appointment.  Patient needs interpreter to speak on phone (Interpreter:  Donata Clay ID: [id number]); on phone due to COVID status. Case manager discussed oxygen needs for home use and attempted to verify who would be picking him up for day of discharge, however patient said would not know who until date and time of discharge.  Case manager let patient know about appointment for COVID clinic on 12/06/19 @ 0930 and that the clinic would be making appointment for [facility] to establish a PCP.  Discussed that this appointment was very important and that they need to make sure to attend.  Discussed that we would give him his medications at discharge and that subsequent refills would be at the [facility].  Patient denies any further questions.    Final next level of care: Home/Self Care Barriers to Discharge: No Barriers Identified   Patient Goals and CMS Choice Patient states their goals for this hospitalization and ongoing recovery are:: to go home.      Discharge Placement                       Discharge Plan and Services In-house Referral: NA Discharge Planning Services: CM Consult            DME Arranged: Oxygen DME Agency: [employer] Date DME Agency Contacted: 11/24/19 Time DME Agency Contacted: 1500 Representative spoke with at DME Agency: Ian Malkin            Social Determinants of Health (SDOH) Interventions     Readmission Risk Interventions No flowsheet data found.

## 2019-11-24 NOTE — Progress Notes (Signed)
PROGRESS NOTE    Victor Gonzalez  ZOX:[id number] DOB: 1985-09-10 DOA: 11/18/2019 PCP: Patient, No Pcp Per   Chief Complaint  Patient presents with  . COVID symptoms    Brief Narrative:  Victor Gonzalez is a 34 y.o. male with medical history significant for none prior who presents to [facility] ED from home with complaints of shortness of breath of 8 days duration.  Associated with a productive cough of white sputum, sore throat, subjective fever, pleuritic pain worse when he coughs, loss of sense of smell and taste.  He denies abdominal pain, nausea, or vomiting, admits to diarrhea.  He has not tested previously and has not received monoclonal antibodies.  He initially declined remdesivir and baricitinib.  After further discussion, he's now agreeable to remdesivir.  He was admitted for COVID 19 pneumonia.    Subjective: Patient in bed, appears comfortable, denies any headache, no fever, no chest pain or pressure, no shortness of breath , no abdominal pain. No focal weakness.    Assessment & Plan:   Acute Hypoxic Respiratory Failure 2/2 COVID-19 viral pneumonia - unfortunately he is unvaccinated and incurred moderate to severe parenchymal lung injury due to COVID-19 pneumonia, he has been treated with combination of steroids, baricitinib and remdesivir, gradually improving, advance activity and titrate down oxygen.  Most likely home discharge in the next 1 to 2 days on oxygen.      Recent Labs  Lab 11/18/19 2219 11/18/19 2230 11/19/19 0620 11/19/19 0620 11/20/19 0131 11/21/19 0306 11/22/19 0352 11/23/19 0056 11/24/19 0818  WBC   < >  --  9.6   < > 9.8 10.4 10.4 8.4 11.1*  CRP  --   --  9.2*   < > 10.3* 3.5* 1.2* 0.8 0.5  DDIMER  --   --  0.44  --  0.28 0.62* 0.73* 0.67*  --   BNP  --   --   --   --   --  59.8  --   --   --   PROCALCITON  --   --  0.11  --  <0.10 <0.10  --   --   --   AST   < >  --  40   < > 31 23 21 22 18   ALT   < >  --  47*   < > 57* 49* 49*  57* 52*  ALKPHOS   < >  --  74   < > 74 77 70 69 73  BILITOT   < >  --  1.1   < > 1.0 0.8 0.6 0.9 1.0  ALBUMIN   < >  --  3.4*   < > 3.2* 3.0* 2.9* 2.9* 3.2*  SARSCOV2NAA  --  POSITIVE*  --   --   --   --   --   --   --    < > = values in this interval not displayed.     Diarrhea, likely secondary to COVID-19 viral infection Resolved.  Prediabetes  Steroid Induced Hyperglycemia Follow A1c (5.8), SSI and basal insulin, adjust prn   CBG (last 3)  Recent Labs    11/23/19 1631 11/23/19 2054 11/24/19 0727  GLUCAP 210* 216* 153*     DVT prophylaxis: lovenox Code Status: full  Family Communication: Brother updated 11/24/2019 Disposition:   Status is: Inpatient  Remains inpatient appropriate because:Inpatient level of care appropriate due to severity of illness   Dispo:  Patient From: Home  Planned Disposition: Home with  Health Care Svc  Expected discharge date: 11/25/19  Medically stable for discharge: No   Consultants:   none  Procedures:   none  Antimicrobials:  Anti-infectives (From admission, onward)   Start     Dose/Rate Route Frequency Ordered Stop   11/20/19 1000  remdesivir 100 mg in sodium chloride 0.9 % 100 mL IVPB  Status:  Discontinued       "Followed by" Linked Group Details   100 mg 200 mL/hr over 30 Minutes Intravenous Daily 11/19/19 0515 11/19/19 0606   11/20/19 1000  azithromycin (ZITHROMAX) tablet 250 mg  Status:  Discontinued       "Followed by" Linked Group Details   250 mg Oral Daily 11/19/19 0632 11/20/19 0933   11/20/19 1000  remdesivir 100 mg in sodium chloride 0.9 % 100 mL IVPB       "Followed by" Linked Group Details   100 mg 200 mL/hr over 30 Minutes Intravenous Daily 11/19/19 0845 11/23/19 0946   11/19/19 1000  azithromycin (ZITHROMAX) tablet 500 mg       "Followed by" Linked Group Details   500 mg Oral Daily 11/19/19 0632 11/19/19 0818   11/19/19 1000  remdesivir 200 mg in sodium chloride 0.9% 250 mL IVPB       "Followed by"  Linked Group Details   200 mg 580 mL/hr over 30 Minutes Intravenous Once 11/19/19 0845 11/19/19 1228   11/19/19 0600  remdesivir 200 mg in sodium chloride 0.9% 250 mL IVPB  Status:  Discontinued       "Followed by" Linked Group Details   200 mg 580 mL/hr over 30 Minutes Intravenous Once 11/19/19 0515 11/19/19 0606       Objective: Vitals:   11/23/19 2319 11/24/19 0446 11/24/19 0710 11/24/19 0724  BP: 92/63 124/65  106/69  Pulse: 62 84  77  Resp: 18 18 20 19   Temp: 98.2 F (36.8 C) 97.9 F (36.6 C)  98.4 F (36.9 C)  TempSrc: Oral Oral  Oral  SpO2: 99% 97% 95% 96%  Weight:      Height:        Intake/Output Summary (Last 24 hours) at 11/24/2019 1055 Last data filed at 11/24/2019 0500 Gross per 24 hour  Intake 240 ml  Output 1800 ml  Net -1560 ml   Filed Weights   11/19/19 0700 11/19/19 1000 11/20/19 0336  Weight: 86.2 kg 86.2 kg 93.7 kg    Examination:  Awake Alert, No new F.N deficits, Normal affect [place].AT,PERRAL Supple Neck,No JVD, No cervical lymphadenopathy appriciated.  Symmetrical Chest wall movement, Good air movement bilaterally, CTAB RRR,No Gallops, Rubs or new Murmurs, No Parasternal Heave +ve B.Sounds, Abd Soft, No tenderness, No organomegaly appriciated, No rebound - guarding or rigidity. No Cyanosis, Clubbing or edema, No new Rash or bruise   Data Reviewed: I have personally reviewed following labs and imaging studies  CBC: Recent Labs  Lab 11/20/19 0131 11/21/19 0306 11/22/19 0352 11/23/19 0056 11/24/19 0818  WBC 9.8 10.4 10.4 8.4 11.1*  NEUTROABS 8.5* 8.5* 8.1* 6.6 8.5*  HGB 15.7 16.3 15.6 15.8 17.9*  HCT 46.8 47.4 45.0 46.3 52.2*  MCV 91.9 90.6 90.4 89.0 90.8  PLT 306 357 374 359 439*    Basic Metabolic Panel: Recent Labs  Lab 11/19/19 0620 11/19/19 0620 11/20/19 0131 11/21/19 0306 11/22/19 0352 11/23/19 0056 11/24/19 0818  NA 139   < > 144 139 139 138 138  K 4.1   < > 4.0 3.7 3.8 3.8 4.2  CL 100   < >  104 100 104 101 97*   CO2 26   < > 28 27 29 27 30   GLUCOSE 164*   < > 159* 180* 181* 154* 157*  BUN 13   < > 16 16 19 16 17   CREATININE 0.80   < > 0.77 0.73 0.75 0.66 0.85  CALCIUM 8.6*   < > 8.9 8.7* 8.6* 8.3* 8.8*  MG 2.1   < > 2.5* 2.3 2.3 2.2 2.4  PHOS 3.6  --  3.5 3.9 3.7 3.1  --    < > = values in this interval not displayed.    GFR: Estimated Creatinine Clearance: 131.3 mL/min (by C-G formula based on SCr of 0.85 mg/dL).  Liver Function Tests: Recent Labs  Lab 11/20/19 0131 11/21/19 0306 11/22/19 0352 11/23/19 0056 11/24/19 0818  AST 31 23 21 22 18   ALT 57* 49* 49* 57* 52*  ALKPHOS 74 77 70 69 73  BILITOT 1.0 0.8 0.6 0.9 1.0  PROT 7.3 6.7 6.0* 6.3* 6.7  ALBUMIN 3.2* 3.0* 2.9* 2.9* 3.2*    CBG: Recent Labs  Lab 11/23/19 0724 11/23/19 1223 11/23/19 1631 11/23/19 2054 11/24/19 0727  GLUCAP 152* 163* 210* 216* 153*     Recent Results (from the past 240 hour(s))  Respiratory Panel by RT PCR (Flu A&B, Covid) - Nasopharyngeal Swab     Status: Abnormal   Collection Time: 11/18/19 10:30 PM   Specimen: Nasopharyngeal Swab  Result Value Ref Range Status   SARS Coronavirus 2 by RT PCR POSITIVE (A) NEGATIVE Final    Comment: RESULT CALLED TO, READ BACK BY AND VERIFIED WITH: J,GLOSTER @2330  11/18/19 EB (NOTE) SARS-CoV-2 target nucleic acids are DETECTED.  SARS-CoV-2 RNA is generally detectable in upper respiratory specimens  during the acute phase of infection. Positive results are indicative of the presence of the identified virus, but do not rule out bacterial infection or co-infection with other pathogens not detected by the test. Clinical correlation with patient history and other diagnostic information is necessary to determine patient infection status. The expected result is Negative.  Fact Sheet for Patients:  https://www.moore.com/https://www.fda.gov/media/142436/download  Fact Sheet for Healthcare Providers: https://www.young.biz/https://www.fda.gov/media/142435/download  This test is not yet approved or cleared  by the [place] FDA and  has been authorized for detection and/or diagnosis of SARS-CoV-2 by FDA under an Emergency Use Authorization (EUA).  This EUA will remain in effect (meaning this test can be used) fo r the duration of  the COVID-19 declaration under Section 564(b)(1) of the Act, 21 U.S.C. section 360bbb-3(b)(1), unless the authorization is terminated or revoked sooner.      Influenza A by PCR NEGATIVE NEGATIVE Final   Influenza B by PCR NEGATIVE NEGATIVE Final    Comment: (NOTE) The Xpert Xpress SARS-CoV-2/FLU/RSV assay is intended as an aid in  the diagnosis of influenza from Nasopharyngeal swab specimens and  should not be used as a sole basis for treatment. Nasal washings and  aspirates are unacceptable for Xpert Xpress SARS-CoV-2/FLU/RSV  testing.  Fact Sheet for Patients: https://www.moore.com/https://www.fda.gov/media/142436/download  Fact Sheet for Healthcare Providers: https://www.young.biz/https://www.fda.gov/media/142435/download  This test is not yet approved or cleared by the [place] FDA and  has been authorized for detection and/or diagnosis of SARS-CoV-2 by  FDA under an Emergency Use Authorization (EUA). This EUA will remain  in effect (meaning this test can be used) for the duration of the  Covid-19 declaration under Section 564(b)(1) of the Act, 21  U.S.C. section 360bbb-3(b)(1), unless the authorization is  terminated or revoked.  Performed at [facility], [address], [place], [place] [address]   MRSA PCR Screening     Status: None   Collection Time: 11/19/19  6:38 AM   Specimen: Nasopharyngeal  Result Value Ref Range Status   MRSA by PCR NEGATIVE NEGATIVE Final    Comment:        The GeneXpert MRSA Assay (FDA approved for NASAL specimens only), is one component of a comprehensive MRSA colonization surveillance program. It is not intended to diagnose MRSA infection nor to guide or monitor treatment for MRSA infections. Performed at [facility], [address] [place], [place] [address]       Radiology Studies: No results found.   Scheduled Meds: . albuterol  2 puff Inhalation Q6H  . vitamin C  500 mg Oral Daily  . baricitinib  4 mg Oral Daily  . cholecalciferol  1,000 Units Oral Daily  . enoxaparin (LOVENOX) injection  40 mg Subcutaneous Q24H  . folic acid  1 mg Oral Daily  . insulin aspart  0-15 Units Subcutaneous TID WC  . insulin detemir  4 Units Subcutaneous BID  . ipratropium  2 puff Inhalation Q8H  . [START ON 11/25/2019] methylPREDNISolone (SOLU-MEDROL) injection  40 mg Intravenous Daily  . multivitamin with minerals  1 tablet Oral Daily  . oxymetazoline  1 spray Each Nare BID  . pantoprazole  40 mg Oral Daily  . pseudoephedrine  120 mg Oral BID  . thiamine  100 mg Oral Daily  . zinc sulfate  220 mg Oral Daily   Continuous Infusions:    LOS: 5 days    Time spent: over 30 min    Susa Raring, MD [facility]   To contact the attending provider between 7A-7P or the covering provider during after hours 7P-7A, please log into the web site [email or web address] and access using universal [place] password for that web site. If you do not have the password, please call the hospital operator.  11/24/2019, 10:55 AM

## 2019-11-25 LAB — BRAIN NATRIURETIC PEPTIDE: B Natriuretic Peptide: 83 pg/mL (ref 0.0–100.0)

## 2019-11-25 LAB — GLUCOSE, CAPILLARY
Glucose-Capillary: 90 mg/dL (ref 70–99)
Glucose-Capillary: 297 mg/dL — ABNORMAL HIGH (ref 70–99)

## 2019-11-25 MED ORDER — ALBUTEROL SULFATE HFA 108 (90 BASE) MCG/ACT IN AERS
2.0000 | INHALATION_SPRAY | Freq: Four times a day (QID) | RESPIRATORY_TRACT | 0 refills | Status: AC | PRN
Start: 2019-11-25 — End: ?

## 2019-11-25 MED ORDER — ASPIRIN EC 325 MG PO TBEC: 325.0000 mg | DELAYED_RELEASE_TABLET | Freq: Every day | ORAL | 0 refills | Status: AC

## 2019-11-25 MED ORDER — LACTATED RINGERS IV BOLUS
500.0000 mL | Freq: Once | INTRAVENOUS | Status: AC
  Administered 2019-11-25: 500 mL via INTRAVENOUS

## 2019-11-25 NOTE — Discharge Summary (Signed)
Alper Gonzalez JOI:[id number] DOB: 12/29/1985 DOA: 11/18/2019  PCP: Patient, No Pcp Per  Admit date: 11/18/2019  Discharge date: 11/25/2019  Admitted From: Home   Disposition:  Home   Recommendations for Outpatient Follow-up:   Follow up with PCP in 1-2 weeks  PCP Please obtain BMP/CBC, 2 view CXR in 1week,  (see Discharge instructions)   PCP Please follow up on the following pending results:    Home Health: none  Equipment/Devices: 2lit [place] o2  Consultations: None  Discharge Condition: Stable    CODE STATUS: Full    Diet Recommendation: Heart Healthy Low Carb     Chief Complaint  Patient presents with  . COVID symptoms     Brief history of present illness from the day of admission and additional interim summary    Victor Gonzalez a 34 y.o.malewith medical history significant fornone priorwho presents to [facility] from home with complaints of shortness of breath of 8 days duration. Associated with aproductive cough of white sputum, sore throat, subjective fever, pleuritic pain worse when hecoughs, loss of sense of smellandtaste. He denies abdominal pain,nausea,or vomiting, admits to diarrhea.He was diagnosed with COVID-19 pneumonia and admitted to the hospital.                                                                 Hospital Course   Acute Hypoxic Respiratory Failure 2/2 COVID-19 viral pneumonia - unfortunately he is unvaccinated and incurred moderate to severe parenchymal lung injury due to COVID-19 pneumonia, he has been treated with combination of steroids, baricitinib and remdesivir, improved, has finished his course of treatment, will get home oxygen, rescue inhaler will be discharged home with PCP follow-up.  Mildly elevated D-dimer due to inflammation 2 weeks of  full dose aspirin.  SpO2: 93 % O2 Flow Rate (L/min): 2 L/min  Recent Labs  Lab 11/18/19 2219 11/18/19 2230 11/19/19 0620 11/19/19 0620 11/20/19 0131 11/21/19 0306 11/22/19 0352 11/23/19 0056 11/24/19 0818  WBC   < >  --  9.6   < > 9.8 10.4 10.4 8.4 11.1*  CRP  --   --  9.2*   < > 10.3* 3.5* 1.2* 0.8 0.5  DDIMER  --   --  0.44   < > 0.28 0.62* 0.73* 0.67* 0.96*  BNP  --   --   --   --   --  59.8  --   --  29.8  PROCALCITON  --   --  0.11  --  <0.10 <0.10  --   --   --   AST   < >  --  40   < > 31 23 21 22 18   ALT   < >  --  47*   < > 57* 49* 49* 57* 52*  ALKPHOS   < >  --  74   < > 74 77 70 69 73  BILITOT   < >  --  1.1   < > 1.0 0.8 0.6 0.9 1.0  ALBUMIN   < >  --  3.4*   < > 3.2* 3.0* 2.9* 2.9* 3.2*  SARSCOV2NAA  --  POSITIVE*  --   --   --   --   --   --   --    < > = values in this interval not displayed.      Diarrhea, likely secondary to COVID-19 viral infection Resolved.  Prediabetes  Steroid Induced Hyperglycemia Follow A1c (5.8), low Carb diet, follow with PCP.   Discharge diagnosis     Active Problems:   Pneumonia due to COVID-19 virus   COVID-19 virus infection    Discharge instructions    Discharge Instructions    Discharge instructions   Complete by: As directed    Follow with Primary MD  in 7 days   Get CBC, CMP, 2 view Chest X ray -  checked next visit within 1 week by Primary MD    Activity: As tolerated with Full fall precautions use walker/cane & assistance as needed  Disposition Home   Diet: Heart Healthy  Low Carb  Special Instructions: If you have smoked or chewed Tobacco  in the last 2 yrs please stop smoking, stop any regular Alcohol  and or any Recreational drug use.  On your next visit with your primary care physician please Get Medicines reviewed and adjusted.  Please request your Prim.MD to go over all Hospital Tests and Procedure/Radiological results at the follow up, please get all Hospital records sent to your Prim MD by  signing hospital release before you go home.  If you experience worsening of your admission symptoms, develop shortness of breath, life threatening emergency, suicidal or homicidal thoughts you must seek medical attention immediately by calling 911 or calling your MD immediately  if symptoms less severe.  You Must read complete instructions/literature along with all the possible adverse reactions/side effects for all the Medicines you take and that have been prescribed to you. Take any new Medicines after you have completely understood and accpet all the possible adverse reactions/side effects.   Increase activity slowly   Complete by: As directed       Discharge Medications   Allergies as of 11/25/2019   No Known Allergies     Medication List    STOP taking these medications   erythromycin ophthalmic ointment     TAKE these medications   albuterol 108 (90 Base) MCG/ACT inhaler Commonly known as: VENTOLIN HFA Inhale 2 puffs into the lungs every 6 (six) hours as needed for wheezing or shortness of breath.   aspirin EC 325 MG tablet Take 1 tablet (325 mg total) by mouth daily for 14 days.            Durable Medical Equipment  (From admission, onward)         Start     Ordered   11/24/19 1054  For home use only DME oxygen  Once       Question Answer Comment  Length of Need 6 Months   Mode or (Route) Nasal cannula   Liters per Minute 3   Frequency Continuous (stationary and portable oxygen unit needed)   Oxygen conserving device Yes   Oxygen delivery system Gas      11/24/19 1053           Follow-up  Information    Post-COVID Care Clinic at [facility]. Go on 12/06/2019.   Specialty: Family Medicine Why: Appointment 12/06/19 at 09:30 am. MUST GO TO THIS APPOINTMENT. Contact information: [address] [place] [address] [phone]       [facility] Follow up.   Why: This appointment will be made by COVID clinic to  establish a PCP. Contact information: [address] [place] [address] [phone]              Major procedures and Radiology Reports - PLEASE review detailed and final reports thoroughly  -       DG Chest Portable 1 View  Result Date: 11/19/2019 CLINICAL DATA:  Fever and shortness of breath, COVID-19 positivity EXAM: PORTABLE CHEST 1 VIEW COMPARISON:  05/24/2010 FINDINGS: Cardiac shadow is within normal limits. Patchy airspace opacities are noted primarily in the bases consistent with the given clinical history. No acute bony abnormality is noted. Postsurgical changes in the left clavicle are noted. IMPRESSION: Patchy airspace opacities consistent with the given clinical history of COVID-19 positivity. Electronically Signed   By: Alcide Clever M.D.   On: 11/19/2019 01:18    Micro Results     Recent Results (from the past 240 hour(s))  Respiratory Panel by RT PCR (Flu A&B, Covid) - Nasopharyngeal Swab     Status: Abnormal   Collection Time: 11/18/19 10:30 PM   Specimen: Nasopharyngeal Swab  Result Value Ref Range Status   SARS Coronavirus 2 by RT PCR POSITIVE (A) NEGATIVE Final    Comment: RESULT CALLED TO, READ BACK BY AND VERIFIED WITH: J,GLOSTER @2330  11/18/19 EB (NOTE) SARS-CoV-2 target nucleic acids are DETECTED.  SARS-CoV-2 RNA is generally detectable in upper respiratory specimens  during the acute phase of infection. Positive results are indicative of the presence of the identified virus, but do not rule out bacterial infection or co-infection with other pathogens not detected by the test. Clinical correlation with patient history and other diagnostic information is necessary to determine patient infection status. The expected result is Negative.  Fact Sheet for Patients:  11/20/19  Fact Sheet for Healthcare Providers: https://www.moore.com/  This test is not yet approved or cleared by  the https://www.young.biz/ FDA and  has been authorized for detection and/or diagnosis of SARS-CoV-2 by FDA under an Emergency Use Authorization (EUA).  This EUA will remain in effect (meaning this test can be used) fo r the duration of  the COVID-19 declaration under Section 564(b)(1) of the Act, 21 U.S.C. section 360bbb-3(b)(1), unless the authorization is terminated or revoked sooner.      Influenza A by PCR NEGATIVE NEGATIVE Final   Influenza B by PCR NEGATIVE NEGATIVE Final    Comment: (NOTE) The Xpert Xpress SARS-CoV-2/FLU/RSV assay is intended as an aid in  the diagnosis of influenza from Nasopharyngeal swab specimens and  should not be used as a sole basis for treatment. Nasal washings and  aspirates are unacceptable for Xpert Xpress SARS-CoV-2/FLU/RSV  testing.  Fact Sheet for Patients: [place]  Fact Sheet for Healthcare Providers: https://www.moore.com/  This test is not yet approved or cleared by the https://www.young.biz/ FDA and  has been authorized for detection and/or diagnosis of SARS-CoV-2 by  FDA under an Emergency Use Authorization (EUA). This EUA will remain  in effect (meaning this test can be used) for the duration of the  Covid-19 declaration under Section 564(b)(1) of the Act, 21  U.S.C. section 360bbb-3(b)(1), unless the authorization is  terminated or revoked. Performed at [facility], [address] [place], [place] [address]   MRSA PCR Screening     Status: None   Collection Time: 11/19/19  6:38 AM   Specimen: Nasopharyngeal  Result Value Ref Range Status   MRSA by PCR NEGATIVE NEGATIVE Final    Comment:        The GeneXpert MRSA Assay (FDA approved for NASAL specimens only), is one component of a comprehensive MRSA colonization surveillance program. It is not intended to diagnose MRSA infection nor to guide or monitor treatment for MRSA infections. Performed at [facility], [address] [place], [place] [address]     Today   Subjective    Victor Gonzalez today has no headache,no chest abdominal pain,no new weakness tingling or numbness, feels much better wants to go home today.    Objective   Blood pressure 100/68, pulse 73, temperature 98.2 F (36.8 C), temperature source Oral, resp. rate 16, height 5\' 6"  (1.676 m), weight 93.7 kg, SpO2 93 %.   Intake/Output Summary (Last 24 hours) at 11/25/2019 1013 Last data filed at 11/25/2019 0455 Gross per 24 hour  Intake 600 ml  Output 700 ml  Net -100 ml    Exam  Awake Alert, No new F.N deficits, Normal affect [place].AT,PERRAL Supple Neck,No JVD, No cervical lymphadenopathy appriciated.  Symmetrical Chest wall movement, Good air movement bilaterally, CTAB RRR,No Gallops,Rubs or new Murmurs, No Parasternal Heave +ve B.Sounds, Abd Soft, Non tender, No organomegaly appriciated, No rebound -guarding or rigidity. No Cyanosis, Clubbing or edema, No new Rash or bruise   Data Review   CBC w Diff:  Lab Results  Component Value Date   WBC 11.1 (H) 11/24/2019   HGB 17.9 (H) 11/24/2019   HCT 52.2 (H) 11/24/2019   PLT 439 (H) 11/24/2019   LYMPHOPCT 10 11/24/2019   MONOPCT 5 11/24/2019   EOSPCT 0 11/24/2019   BASOPCT 0 11/24/2019    CMP:  Lab Results  Component Value Date   NA 138 11/24/2019   K 4.2 11/24/2019   CL 97 (L) 11/24/2019   CO2 30 11/24/2019   BUN 17 11/24/2019   CREATININE 0.85 11/24/2019   PROT 6.7 11/24/2019   ALBUMIN 3.2 (L) 11/24/2019   BILITOT 1.0 11/24/2019   ALKPHOS 73 11/24/2019   AST 18 11/24/2019   ALT 52 (H) 11/24/2019  .   Total Time in preparing paper work, data evaluation and todays exam - 35 minutes  Susa RaringPrashant Danyell Shader M.D on 11/25/2019 at 10:13 AM  [facility]   Office  [phone]

## 2019-11-25 NOTE — Care Management (Signed)
Case manager received phone call from [place] at [employer] regarding oxygen delivery with patient/family not being at home.  Called brother [place] with Interpre[employer] ([place] [phone]) and once again verifed correct address in Epic, brother said that they are home currently.  Discussed to be on the look out for [employer] to be delivering oxygen to the house.  Verbalized understanding.  Case manager called back Zach at [employer] and let know that spoke with brother.

## 2019-11-25 NOTE — Progress Notes (Signed)
Ramel Ortega-Tolentino to be D/C'd home per MD order. Discussed (via interpreter) with the patient and all questions fully answered.   VVS, Skin clean, dry and intact without evidence of skin break down, no evidence of skin tears noted.  IV catheter discontinued intact. Site without signs and symptoms of complications. Dressing and pressure applied.  An After Visit Summary ( Spanish) was printed and given to the patient.  Patient escorted via WC, and D/C home via private auto.  Dickie La  11/25/2019 3:31 PM

## 2019-11-25 NOTE — Discharge Instructions (Signed)
Follow with Primary MD  in 7 days   Get CBC, CMP, 2 view Chest X ray -  checked next visit within 1 week by Primary MD    Activity: As tolerated with Full fall precautions use walker/cane & assistance as needed  Disposition Home   Diet: Heart Healthy  Low Carb  Special Instructions: If you have smoked or chewed Tobacco  in the last 2 yrs please stop smoking, stop any regular Alcohol  and or any Recreational drug use.  On your next visit with your primary care physician please Get Medicines reviewed and adjusted.  Please request your Prim.MD to go over all Hospital Tests and Procedure/Radiological results at the follow up, please get all Hospital records sent to your Prim MD by signing hospital release before you go home.  If you experience worsening of your admission symptoms, develop shortness of breath, life threatening emergency, suicidal or homicidal thoughts you must seek medical attention immediately by calling 911 or calling your MD immediately  if symptoms less severe.  You Must read complete instructions/literature along with all the possible adverse reactions/side effects for all the Medicines you take and that have been prescribed to you. Take any new Medicines after you have completely understood and accpet all the possible adverse reactions/side effects.          Person Under Monitoring Name: Victor Gonzalez  Location: [address] [place] [address]   Infection Prevention Recommendations for Individuals Confirmed to have, or Being Evaluated for, 2019 Novel Coronavirus (COVID-19) Infection Who Receive Care at Home  Individuals who are confirmed to have, or are being evaluated for, COVID-19 should follow the prevention steps below until a healthcare provider or local or state health department says they can return to normal activities.  Stay home except to get medical care You should restrict activities outside your home, except for getting  medical care. Do not go to work, school, or public areas, and do not use public transportation or taxis.  Call ahead before visiting your doctor Before your medical appointment, call the healthcare provider and tell them that you have, or are being evaluated for, COVID-19 infection. This will help the healthcare provider's office take steps to keep other people from getting infected. Ask your healthcare provider to call the local or state health department.  Monitor your symptoms Seek prompt medical attention if your illness is worsening (e.g., difficulty breathing). Before going to your medical appointment, call the healthcare provider and tell them that you have, or are being evaluated for, COVID-19 infection. Ask your healthcare provider to call the local or state health department.  Wear a facemask You should wear a facemask that covers your nose and mouth when you are in the same room with other people and when you visit a healthcare provider. People who live with or visit you should also wear a facemask while they are in the same room with you.  Separate yourself from other people in your home As much as possible, you should stay in a different room from other people in your home. Also, you should use a separate bathroom, if available.  Avoid sharing household items You should not share dishes, drinking glasses, cups, eating utensils, towels, bedding, or other items with other people in your home. After using these items, you should wash them thoroughly with soap and water.  Cover your coughs and sneezes Cover your mouth and nose with a tissue when you cough or sneeze, or you can cough or  sneeze into your sleeve. Throw used tissues in a lined trash can, and immediately wash your hands with soap and water for at least 20 seconds or use an alcohol-based hand rub.  Wash your [facility] your hands often and thoroughly with soap and water for at least 20 seconds. You can use an  alcohol-based hand sanitizer if soap and water are not available and if your hands are not visibly dirty. Avoid touching your eyes, nose, and mouth with unwashed hands.   Prevention Steps for Caregivers and Household Members of Individuals Confirmed to have, or Being Evaluated for, COVID-19 Infection Being Cared for in the Home  If you live with, or provide care at home for, a person confirmed to have, or being evaluated for, COVID-19 infection please follow these guidelines to prevent infection:  Follow healthcare provider's instructions Make sure that you understand and can help the patient follow any healthcare provider instructions for all care.  Provide for the patient's basic needs You should help the patient with basic needs in the home and provide support for getting groceries, prescriptions, and other personal needs.  Monitor the patient's symptoms If they are getting sicker, call his or her medical provider and tell them that the patient has, or is being evaluated for, COVID-19 infection. This will help the healthcare provider's office take steps to keep other people from getting infected. Ask the healthcare provider to call the local or state health department.  Limit the number of people who have contact with the patient  If possible, have only one caregiver for the patient.  Other household members should stay in another home or place of residence. If this is not possible, they should stay  in another room, or be separated from the patient as much as possible. Use a separate bathroom, if available.  Restrict visitors who do not have an essential need to be in the home.  Keep older adults, very young children, and other sick people away from the patient Keep older adults, very young children, and those who have compromised immune systems or chronic health conditions away from the patient. This includes people with chronic heart, lung, or kidney conditions, diabetes, and  cancer.  Ensure good ventilation Make sure that shared spaces in the home have good air flow, such as from an air conditioner or an opened window, weather permitting.  Wash your hands often  Wash your hands often and thoroughly with soap and water for at least 20 seconds. You can use an alcohol based hand sanitizer if soap and water are not available and if your hands are not visibly dirty.  Avoid touching your eyes, nose, and mouth with unwashed hands.  Use disposable paper towels to dry your hands. If not available, use dedicated cloth towels and replace them when they become wet.  Wear a facemask and gloves  Wear a disposable facemask at all times in the room and gloves when you touch or have contact with the patient's blood, body fluids, and/or secretions or excretions, such as sweat, saliva, sputum, nasal mucus, vomit, urine, or feces.  Ensure the mask fits over your nose and mouth tightly, and do not touch it during use.  Throw out disposable facemasks and gloves after using them. Do not reuse.  Wash your hands immediately after removing your facemask and gloves.  If your personal clothing becomes contaminated, carefully remove clothing and launder. Wash your hands after handling contaminated clothing.  Place all used disposable facemasks, gloves, and other waste  in a lined container before disposing them with other household waste.  Remove gloves and wash your hands immediately after handling these items.  Do not share dishes, glasses, or other household items with the patient  Avoid sharing household items. You should not share dishes, drinking glasses, cups, eating utensils, towels, bedding, or other items with a patient who is confirmed to have, or being evaluated for, COVID-19 infection.  After the person uses these items, you should wash them thoroughly with soap and water.  Wash laundry thoroughly  Immediately remove and wash clothes or bedding that have blood, body  fluids, and/or secretions or excretions, such as sweat, saliva, sputum, nasal mucus, vomit, urine, or feces, on them.  Wear gloves when handling laundry from the patient.  Read and follow directions on labels of laundry or clothing items and detergent. In general, wash and dry with the warmest temperatures recommended on the label.  Clean all areas the individual has used often  Clean all touchable surfaces, such as counters, tabletops, doorknobs, bathroom fixtures, toilets, phones, keyboards, tablets, and bedside tables, every day. Also, clean any surfaces that may have blood, body fluids, and/or secretions or excretions on them.  Wear gloves when cleaning surfaces the patient has come in contact with.  Use a diluted bleach solution (e.g., dilute bleach with 1 part bleach and 10 parts water) or a household disinfectant with a label that says EPA-registered for coronaviruses. To make a bleach solution at home, add 1 tablespoon of bleach to 1 quart (4 cups) of water. For a larger supply, add  cup of bleach to 1 gallon (16 cups) of water.  Read labels of cleaning products and follow recommendations provided on product labels. Labels contain instructions for safe and effective use of the cleaning product including precautions you should take when applying the product, such as wearing gloves or eye protection and making sure you have good ventilation during use of the product.  Remove gloves and wash hands immediately after cleaning.  Monitor yourself for signs and symptoms of illness Caregivers and household members are considered close contacts, should monitor their health, and will be asked to limit movement outside of the home to the extent possible. Follow the monitoring steps for close contacts listed on the symptom monitoring form.   ? If you have additional questions, contact your local health department or call the epidemiologist on call at [phone] (available 24/7). ? This  guidance is subject to change. For the most up-to-date guidance from [facility], please refer to their website: [email or web address].pl

## 2019-12-06 ENCOUNTER — Ambulatory Visit (INDEPENDENT_AMBULATORY_CARE_PROVIDER_SITE_OTHER): Payer: HRSA Program | Admitting: Nurse Practitioner

## 2019-12-06 ENCOUNTER — Ambulatory Visit
Admission: RE | Admit: 2019-12-06 | Discharge: 2019-12-06 | Disposition: A | Discharge status: Home / Self Care | Payer: No Typology Code available for payment source | Source: Ambulatory Visit | Attending: Nurse Practitioner | Admitting: Nurse Practitioner

## 2019-12-06 ENCOUNTER — Other Ambulatory Visit: Payer: Self-pay

## 2019-12-06 VITALS — BP 102/68 | HR 80 | Temp 97.5°F | Ht 66.0 in | Wt 208.0 lb

## 2019-12-06 DIAGNOSIS — J1282 Pneumonia due to Coronavirus disease 2019: Secondary | ICD-10-CM

## 2019-12-06 DIAGNOSIS — R059 Cough, unspecified: Secondary | ICD-10-CM | POA: Insufficient documentation

## 2019-12-06 DIAGNOSIS — R21 Rash and other nonspecific skin eruption: Secondary | ICD-10-CM | POA: Diagnosis not present

## 2019-12-06 DIAGNOSIS — U071 COVID-19: Secondary | ICD-10-CM | POA: Diagnosis not present

## 2019-12-06 MED ORDER — PREDNISONE 20 MG PO TABS: 20.0000 mg | ORAL_TABLET | Freq: Every day | ORAL | 0 refills | Status: AC

## 2019-12-06 NOTE — Patient Instructions (Addendum)
Covid Pneumonia Respiratory failure with hypoxia:  Patient walked in office today O2 sats remained above 955 for entire walk heart rate stable  Stay well hydrated  Stay active  Deep breathing exercises  May take tylenol or fever or pain  May take mucinex DM twice daily  May take zyrtec daily  Will order prednisone  Will check labs  Will order chest x ray   Rash:  Will order prednisone  May use benadryl cream   Follow up:  Follow up in 2-4 weeks or sooner if needed

## 2019-12-06 NOTE — Assessment & Plan Note (Signed)
Respiratory failure with hypoxia:  Patient walked in office today O2 sats remained above 955 for entire walk heart rate stable  Stay well hydrated  Stay active  Deep breathing exercises  May take tylenol or fever or pain  May take mucinex DM twice daily  May take zyrtec daily  Will order prednisone  Will check labs  Will order chest x ray   Rash:  Will order prednisone  May use benadryl cream   Follow up:  Follow up in 2-4 weeks or sooner if needed

## 2019-12-06 NOTE — Progress Notes (Signed)
@Patient  ID: Victor Gonzalez, male    DOB: 1985/06/22, 34 y.o.   MRN: [id number]  Chief Complaint  Patient presents with  . Hospitalization Follow-up    COVID Pos: 9/23 Hosp: 9/23-9/30 Sx: Cough has no used O2 for 1 week. red bumbs on chest and back    Referring provider: No ref. provider found  34 year old male with history of hypertension.  HPI  Patient presents today for post COVID care clinic visit/hospital follow-up.  Patient was admitted to the hospital on 11/18/2019 through 11/25/2019.  He was admitted for acute hypoxic respiratory failure in pneumonia from Covid.  Patient was treated with IV steroids, baricitinib, remdesivir, and oxygen.  Patient did have mildly elevated D-dimer and was advised to take aspirin full dose daily for 2 weeks.  Patient was discharged home from the hospital on 2 L of O2.  Patient states that he has been stable since hospital discharge.  He has not been using his oxygen.  He denies any significant shortness of breath.  Patient has a breakout her rash on upper arms and chest since discharge home from the hospital. Denies f/c/s, n/v/d, hemoptysis, PND, chest pain or edema.      No Known Allergies   There is no immunization history on file for this patient.  Past Medical History:  Diagnosis Date  . Hypertension   . Thyroid disease     Tobacco History: Social History   Tobacco Use  Smoking Status Never Smoker  Smokeless Tobacco Never Used   Counseling given: Not Answered   Outpatient Encounter Medications as of 12/06/2019  Medication Sig  . albuterol (VENTOLIN HFA) 108 (90 Base) MCG/ACT inhaler Inhale 2 puffs into the lungs every 6 (six) hours as needed for wheezing or shortness of breath.  02/05/2020 aspirin EC 325 MG tablet Take 1 tablet (325 mg total) by mouth daily for 14 days. (Patient not taking: Reported on 12/06/2019)  . predniSONE (DELTASONE) 20 MG tablet Take 1 tablet (20 mg total) by mouth daily with breakfast for 5 days.   No  facility-administered encounter medications on file as of 12/06/2019.     Review of Systems  Review of Systems  Constitutional: Negative.  Negative for fever.  HENT: Negative.   Respiratory: Positive for cough. Negative for shortness of breath.   Cardiovascular: Negative.  Negative for chest pain, palpitations and leg swelling.  Gastrointestinal: Negative.   Allergic/Immunologic: Negative.   Neurological: Negative.   Psychiatric/Behavioral: Negative.        Physical Exam  BP 102/68 (BP Location: Left Arm)   Pulse 80   Temp (!) 97.5 F (36.4 C)   Ht 5\' 6"  (1.676 m)   Wt 208 lb (94.3 kg)   SpO2 96%   BMI 33.57 kg/m   Wt Readings from Last 5 Encounters:  12/06/19 208 lb (94.3 kg)  11/20/19 206 lb 9.1 oz (93.7 kg)  01/09/17 204 lb (92.5 kg)  09/03/16 205 lb (93 kg)  12/15/15 195 lb (88.5 kg)     Physical Exam Vitals and nursing note reviewed.  Constitutional:      General: He is not in acute distress.    Appearance: He is well-developed.  Cardiovascular:     Rate and Rhythm: Normal rate and regular rhythm.  Pulmonary:     Effort: Pulmonary effort is normal.     Breath sounds: Normal breath sounds.  Musculoskeletal:     Right lower leg: No edema.     Left lower leg: No edema.  Skin:  General: Skin is warm and dry.  Neurological:     Mental Status: He is alert and oriented to person, place, and time.  Psychiatric:        Mood and Affect: Mood normal.        Behavior: Behavior normal.      Imaging: DG Chest Portable 1 View  Result Date: 11/19/2019 CLINICAL DATA:  Fever and shortness of breath, COVID-19 positivity EXAM: PORTABLE CHEST 1 VIEW COMPARISON:  05/24/2010 FINDINGS: Cardiac shadow is within normal limits. Patchy airspace opacities are noted primarily in the bases consistent with the given clinical history. No acute bony abnormality is noted. Postsurgical changes in the left clavicle are noted. IMPRESSION: Patchy airspace opacities consistent with  the given clinical history of COVID-19 positivity. Electronically Signed   By: Alcide Clever M.D.   On: 11/19/2019 01:18     Assessment & Plan:   COVID-19 virus infection Respiratory failure with hypoxia:  Patient walked in office today O2 sats remained above 955 for entire walk heart rate stable  Stay well hydrated  Stay active  Deep breathing exercises  May take tylenol or fever or pain  May take mucinex DM twice daily  May take zyrtec daily  Will order prednisone  Will check labs  Will order chest x ray   Rash:  Will order prednisone  May use benadryl cream   Follow up:  Follow up in 2-4 weeks or sooner if needed       Ivonne Andrew, NP 12/06/2019

## 2019-12-07 LAB — CBC
Hematocrit: 42.2 % (ref 37.5–51.0)
Hemoglobin: 14.7 g/dL (ref 13.0–17.7)
MCH: 31.3 pg (ref 26.6–33.0)
MCHC: 34.8 g/dL (ref 31.5–35.7)
MCV: 90 fL (ref 79–97)
Platelets: 217 10*3/uL (ref 150–450)
RBC: 4.69 x10E6/uL (ref 4.14–5.80)
RDW: 12.8 % (ref 11.6–15.4)
WBC: 4.4 10*3/uL (ref 3.4–10.8)

## 2019-12-07 LAB — COMPREHENSIVE METABOLIC PANEL
ALT: 57 IU/L — ABNORMAL HIGH (ref 0–44)
AST: 26 IU/L (ref 0–40)
Albumin/Globulin Ratio: 1.8 (ref 1.2–2.2)
Albumin: 4.1 g/dL (ref 4.0–5.0)
Alkaline Phosphatase: 103 IU/L (ref 44–121)
BUN/Creatinine Ratio: 16 (ref 9–20)
BUN: 12 mg/dL (ref 6–20)
Bilirubin Total: 0.8 mg/dL (ref 0.0–1.2)
CO2: 23 mmol/L (ref 20–29)
Calcium: 9.5 mg/dL (ref 8.7–10.2)
Chloride: 105 mmol/L (ref 96–106)
Creatinine, Ser: 0.75 mg/dL — ABNORMAL LOW (ref 0.76–1.27)
GFR calc Af Amer: 138 mL/min/{1.73_m2} (ref >59)
GFR calc non Af Amer: 120 mL/min/{1.73_m2} (ref >59)
Globulin, Total: 2.3 g/dL (ref 1.5–4.5)
Glucose: 128 mg/dL — ABNORMAL HIGH (ref 65–99)
Potassium: 4 mmol/L (ref 3.5–5.2)
Sodium: 145 mmol/L — ABNORMAL HIGH (ref 134–144)
Total Protein: 6.4 g/dL (ref 6.0–8.5)

## 2019-12-08 ENCOUNTER — Other Ambulatory Visit: Payer: Self-pay | Admitting: Nurse Practitioner

## 2019-12-08 DIAGNOSIS — J1282 Pneumonia due to coronavirus disease 2019: Secondary | ICD-10-CM

## 2019-12-27 ENCOUNTER — Ambulatory Visit (INDEPENDENT_AMBULATORY_CARE_PROVIDER_SITE_OTHER): Payer: HRSA Program | Admitting: Nurse Practitioner

## 2019-12-27 VITALS — BP 128/88 | HR 64 | Temp 97.5°F | Ht 66.0 in | Wt 211.0 lb

## 2019-12-27 DIAGNOSIS — J1282 Pneumonia due to coronavirus disease 2019: Secondary | ICD-10-CM

## 2019-12-27 DIAGNOSIS — R748 Abnormal levels of other serum enzymes: Secondary | ICD-10-CM | POA: Insufficient documentation

## 2019-12-27 DIAGNOSIS — R0602 Shortness of breath: Secondary | ICD-10-CM | POA: Diagnosis not present

## 2019-12-27 DIAGNOSIS — U071 COVID-19: Secondary | ICD-10-CM | POA: Diagnosis not present

## 2019-12-27 MED ORDER — PREDNISONE 20 MG PO TABS: 20.0000 mg | ORAL_TABLET | Freq: Every day | ORAL | 0 refills | Status: AC

## 2019-12-27 NOTE — Patient Instructions (Addendum)
COVID-19 virus infection Respiratory failure with hypoxia:   Stay well hydrated  Stay active  Deep breathing exercises  May take tylenol or fever or pain  May take mucinex DM twice daily  May take zyrtec daily  Will order prednisone  Will re-check labs - elevated liver enzymes  Referral has been placed to pulmonary -last chest xray showed developing pulmonary fibrosis     Follow up:  Follow up in 3 months or sooner if needed  

## 2019-12-27 NOTE — Assessment & Plan Note (Signed)
COVID-19 virus infection Respiratory failure with hypoxia:   Stay well hydrated  Stay active  Deep breathing exercises  May take tylenol or fever or pain  May take mucinex DM twice daily  May take zyrtec daily  Will order prednisone  Will re-check labs - elevated liver enzymes  Referral has been placed to pulmonary -last chest xray showed developing pulmonary fibrosis     Follow up:  Follow up in 3 months or sooner if needed

## 2019-12-27 NOTE — Progress Notes (Signed)
@Patient  ID: Victor Gonzalez, male    DOB: [birth year], 34 y.o.   MRN: [id number]  Chief Complaint  Patient presents with  . Follow-up    Still feels SOB    Referring provider: No ref. provider found   34 year old male with history of hypertension.  HPI  Patient presents today for post COVID care clinic visit follow-up.  Spanish interpreter was used for this visit today.  At patient's last visit he was order chest x-ray and labs.  Liver enzymes were still elevated and he will need labs rechecked today.  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  A referral was placed to pulmonary but no appointment was scheduled.  We contacted the pulmonary office during visit this morning and did get an appointment scheduled with Dr. 20 in December.  Patient states that overall he is slowly improving.  He does complain of shortness of breath with exertion.  He did complete a round of prednisone at last visit.  We discussed continuing deep breathing exercises in office today. Denies f/c/s, n/v/d, hemoptysis, chest pain or edema.         No Known Allergies   There is no immunization history on file for this patient.  Past Medical History:  Diagnosis Date  . Hypertension   . Thyroid disease     Tobacco History: Social History   Tobacco Use  Smoking Status Never Smoker  Smokeless Tobacco Never Used   Counseling given: Not Answered   Outpatient Encounter Medications as of 12/27/2019  Medication Sig  . albuterol (VENTOLIN HFA) 108 (90 Base) MCG/ACT inhaler Inhale 2 puffs into the lungs every 6 (six) hours as needed for wheezing or shortness of breath.  . predniSONE (DELTASONE) 20 MG tablet Take 1 tablet (20 mg total) by mouth daily with breakfast for 5 days.   No facility-administered encounter medications on file as of 12/27/2019.     Review of Systems  Review of Systems  Constitutional: Negative.  Negative for fatigue and fever.  HENT: Negative.    Respiratory: Positive for shortness of breath. Negative for cough.   Cardiovascular: Negative.  Negative for chest pain, palpitations and leg swelling.  Gastrointestinal: Negative.   Allergic/Immunologic: Negative.   Neurological: Negative.   Psychiatric/Behavioral: Negative.        Physical Exam  BP 128/88 (BP Location: Left Arm)   Pulse 64   Temp (!) 97.5 F (36.4 C)   Ht 5\' 6"  (1.676 m)   Wt 211 lb (95.7 kg)   SpO2 97%   BMI 34.06 kg/m   Wt Readings from Last 5 Encounters:  12/27/19 211 lb (95.7 kg)  12/06/19 208 lb (94.3 kg)  11/20/19 206 lb 9.1 oz (93.7 kg)  01/09/17 204 lb (92.5 kg)  09/03/16 205 lb (93 kg)     Physical Exam Vitals and nursing note reviewed.  Constitutional:      General: He is not in acute distress.    Appearance: He is well-developed.  Cardiovascular:     Rate and Rhythm: Normal rate and regular rhythm.  Pulmonary:     Effort: Pulmonary effort is normal.     Breath sounds: Normal breath sounds.  Musculoskeletal:     Right lower leg: No edema.     Left lower leg: No edema.  Skin:    General: Skin is warm and dry.  Neurological:     Mental Status: He is alert and oriented to person, place, and time.  Psychiatric:  Mood and Affect: Mood normal.        Behavior: Behavior normal.      Imaging: DG Chest 2 View  Result Date: 12/06/2019 CLINICAL DATA:  34 year old male with history of COVID infection complaining of persistent cough. EXAM: CHEST - 2 VIEW COMPARISON:  Chest x-ray 11/19/2019. FINDINGS: Lung volumes have slightly increased compared to the prior study. There continues to be patchy ill-defined opacities and areas of interstitial prominence throughout the lungs bilaterally, overall, slightly improved compared to the prior study. No confluent consolidative airspace disease. No pleural effusions. No pneumothorax. No definite suspicious appearing pulmonary nodules or masses are noted. No evidence of pulmonary edema. Heart size  is normal. Upper mediastinal contours are within normal limits. Orthopedic fixation hardware in the left clavicle incidentally noted. IMPRESSION: 1. 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 Electronically Signed   By: Trudie Reed M.D.   On: 12/06/2019 16:51     Assessment & Plan:   Pneumonia due to COVID-19 virus COVID-19 virus infection Respiratory failure with hypoxia:   Stay well hydrated  Stay active  Deep breathing exercises  May take tylenol or fever or pain  May take mucinex DM twice daily  May take zyrtec daily  Will order prednisone  Will re-check labs - elevated liver enzymes  Referral has been placed to pulmonary -last chest xray showed developing pulmonary fibrosis     Follow up:  Follow up in 3 months or sooner if needed      Ivonne Andrew, NP 12/27/2019

## 2019-12-28 LAB — COMPREHENSIVE METABOLIC PANEL
ALT: 55 IU/L — ABNORMAL HIGH (ref 0–44)
AST: 30 IU/L (ref 0–40)
Albumin/Globulin Ratio: 2.2 (ref 1.2–2.2)
Albumin: 4.3 g/dL (ref 4.0–5.0)
Alkaline Phosphatase: 104 IU/L (ref 44–121)
BUN/Creatinine Ratio: 17 (ref 9–20)
BUN: 12 mg/dL (ref 6–20)
Bilirubin Total: 0.7 mg/dL (ref 0.0–1.2)
CO2: 19 mmol/L — ABNORMAL LOW (ref 20–29)
Calcium: 9.2 mg/dL (ref 8.7–10.2)
Chloride: 106 mmol/L (ref 96–106)
Creatinine, Ser: 0.72 mg/dL — ABNORMAL LOW (ref 0.76–1.27)
GFR calc Af Amer: 141 mL/min/{1.73_m2} (ref >59)
GFR calc non Af Amer: 122 mL/min/{1.73_m2} (ref >59)
Globulin, Total: 2 g/dL (ref 1.5–4.5)
Glucose: 139 mg/dL — ABNORMAL HIGH (ref 65–99)
Potassium: 4.3 mmol/L (ref 3.5–5.2)
Sodium: 142 mmol/L (ref 134–144)
Total Protein: 6.3 g/dL (ref 6.0–8.5)

## 2019-12-28 LAB — CBC
Hematocrit: 45.2 % (ref 37.5–51.0)
Hemoglobin: 15.4 g/dL (ref 13.0–17.7)
MCH: 30.5 pg (ref 26.6–33.0)
MCHC: 34.1 g/dL (ref 31.5–35.7)
MCV: 90 fL (ref 79–97)
Platelets: 242 10*3/uL (ref 150–450)
RBC: 5.05 x10E6/uL (ref 4.14–5.80)
RDW: 13.4 % (ref 11.6–15.4)
WBC: 5.2 10*3/uL (ref 3.4–10.8)

## 2020-01-06 ENCOUNTER — Ambulatory Visit (INDEPENDENT_AMBULATORY_CARE_PROVIDER_SITE_OTHER): Payer: Self-pay | Admitting: Nurse Practitioner

## 2020-01-06 ENCOUNTER — Other Ambulatory Visit: Payer: Self-pay

## 2020-01-06 ENCOUNTER — Encounter: Payer: Self-pay | Admitting: Nurse Practitioner

## 2020-01-06 VITALS — BP 128/87 | HR 74 | Temp 97.7°F | Ht 65.0 in | Wt 211.8 lb

## 2020-01-06 DIAGNOSIS — Z7689 Persons encountering health services in other specified circumstances: Secondary | ICD-10-CM

## 2020-01-06 DIAGNOSIS — Z0000 Encounter for general adult medical examination without abnormal findings: Secondary | ICD-10-CM

## 2020-01-06 DIAGNOSIS — Z8616 Personal history of COVID-19: Secondary | ICD-10-CM

## 2020-01-06 DIAGNOSIS — Z23 Encounter for immunization: Secondary | ICD-10-CM

## 2020-01-06 DIAGNOSIS — R7303 Prediabetes: Secondary | ICD-10-CM

## 2020-01-06 LAB — POCT URINALYSIS DIPSTICK
Bilirubin, UA: NEGATIVE
Glucose, UA: NEGATIVE
Ketones, UA: NEGATIVE
Leukocytes, UA: NEGATIVE
Nitrite, UA: NEGATIVE
Protein, UA: NEGATIVE
Spec Grav, UA: 1.025 (ref 1.010–1.025)
Urobilinogen, UA: 0.2 E.U./dL
pH, UA: 6.5 (ref 5.0–8.0)

## 2020-01-06 LAB — POCT GLYCOSYLATED HEMOGLOBIN (HGB A1C)
HbA1c POC (<> result, manual entry): 5.6 % (ref 4.0–5.6)
HbA1c, POC (controlled diabetic range): 5.6 % (ref 0.0–7.0)
HbA1c, POC (prediabetic range): 5.6 % — AB (ref 5.7–6.4)
Hemoglobin A1C: 5.6 % (ref 4.0–5.6)

## 2020-01-06 LAB — GLUCOSE, POCT (MANUAL RESULT ENTRY): POC Glucose: 118 mg/dl — AB (ref 70–99)

## 2020-01-06 NOTE — Patient Instructions (Signed)
Diabetes mellitus y nutricin, en adultos Diabetes Mellitus and Nutrition, Adult Si sufre de diabetes (diabetes mellitus), es muy importante tener hbitos alimenticios saludables debido a que sus niveles de azcar en la sangre (glucosa) se ven afectados en gran medida por lo que come y bebe. Comer alimentos saludables en las cantidades adecuadas, aproximadamente a la misma hora todos los das, lo ayudar a:  Controlar la glucemia.  Disminuir el riesgo de sufrir una enfermedad cardaca.  Mejorar la presin arterial.  Alcanzar o mantener un peso saludable. Todas las personas que sufren de diabetes son diferentes y cada una tiene necesidades diferentes en cuanto a un plan de alimentacin. El mdico puede recomendarle que trabaje con un especialista en dietas y nutricin (nutricionista) para elaborar el mejor plan para usted. Su plan de alimentacin puede variar segn factores como:  Las caloras que necesita.  Los medicamentos que toma.  Su peso.  Sus niveles de glucemia, presin arterial y colesterol.  Su nivel de actividad.  Otras afecciones que tenga, como enfermedades cardacas o renales. Cmo me afectan los carbohidratos? Los carbohidratos, o hidratos de carbono, afectan su nivel de glucemia ms que cualquier otro tipo de alimento. La ingesta de carbohidratos naturalmente aumenta la cantidad de glucosa en la sangre. El recuento de carbohidratos es un mtodo destinado a llevar un registro de la cantidad de carbohidratos que se consumen. El recuento de carbohidratos es importante para mantener la glucemia a un nivel saludable, especialmente si utiliza insulina o toma determinados medicamentos por va oral para la diabetes. Es importante conocer la cantidad de carbohidratos que se pueden ingerir en cada comida sin correr ningn riesgo. Esto es diferente en cada persona. Su nutricionista puede ayudarlo a calcular la cantidad de carbohidratos que debe ingerir en cada comida y en cada  refrigerio. Entre los alimentos que contienen carbohidratos, se incluyen:  Pan, cereal, arroz, pastas y galletas.  Papas y maz.  Guisantes, frijoles y lentejas.  Leche y yogur.  Frutas y jugo.  Postres, como pasteles, galletas, helado y caramelos. Cmo me afecta el alcohol? El alcohol puede provocar disminuciones sbitas de la glucemia (hipoglucemia), especialmente si utiliza insulina o toma determinados medicamentos por va oral para la diabetes. La hipoglucemia es una afeccin potencialmente mortal. Los sntomas de la hipoglucemia (somnolencia, mareos y confusin) son similares a los sntomas de haber consumido demasiado alcohol. Si el mdico afirma que el alcohol es seguro para usted, siga estas pautas:  Limite el consumo de alcohol a no ms de 1medida por da si es mujer y no est embarazada, y a 2medidas si es hombre. Una medida equivale a 12oz (355ml) de cerveza, 5oz (148ml) de vino o 1oz (44ml) de bebidas alcohlicas de alta graduacin.  No beba con el estmago vaco.  Mantngase hidratado bebiendo agua, refrescos dietticos o t helado sin azcar.  Tenga en cuenta que los refrescos comunes, los jugos y otras bebida para mezclar pueden contener mucha azcar y se deben contar como carbohidratos. Cules son algunos consejos para seguir este plan?  Leer las etiquetas de los alimentos  Comience por leer el tamao de la porcin en la "Informacin nutricional" en las etiquetas de los alimentos envasados y las bebidas. La cantidad de caloras, carbohidratos, grasas y otros nutrientes mencionados en la etiqueta se basan en una porcin del alimento. Muchos alimentos contienen ms de una porcin por envase.  Verifique la cantidad total de gramos (g) de carbohidratos totales en una porcin. Puede calcular la cantidad de porciones de carbohidratos al dividir el   total de carbohidratos por 15. Por ejemplo, si un alimento tiene un total de 30g de carbohidratos, equivale a 2  porciones de carbohidratos.  Verifique la cantidad de gramos (g) de grasas saturadas y grasas trans en una porcin. Escoja alimentos que no contengan grasa o que tengan un bajo contenido.  Verifique la cantidad de miligramos (mg) de sal (sodio) en una porcin. La mayora de las personas deben limitar la ingesta de sodio total a menos de 2300mg por da.  Siempre consulte la informacin nutricional de los alimentos etiquetados como "con bajo contenido de grasa" o "sin grasa". Estos alimentos pueden tener un mayor contenido de azcar agregada o carbohidratos refinados, y deben evitarse.  Hable con su nutricionista para identificar sus objetivos diarios en cuanto a los nutrientes mencionados en la etiqueta. Al ir de compras  Evite comprar alimentos procesados, enlatados o precocinados. Estos alimentos tienden a tener una mayor cantidad de grasa, sodio y azcar agregada.  Compre en la zona exterior de la tienda de comestibles. Esta zona incluye frutas y verduras frescas, granos a granel, carnes frescas y productos lcteos frescos. Al cocinar  Utilice mtodos de coccin a baja temperatura, como hornear, en lugar de mtodos de coccin a alta temperatura, como frer en abundante aceite.  Cocine con aceites saludables, como el aceite de oliva, canola o girasol.  Evite cocinar con manteca, crema o carnes con alto contenido de grasa. Planificacin de las comidas  Coma las comidas y los refrigerios regularmente, preferentemente a la misma hora todos los das. Evite pasar largos perodos de tiempo sin comer.  Consuma alimentos ricos en fibra, como frutas frescas, verduras, frijoles y cereales integrales. Consulte a su nutricionista sobre cuntas porciones de carbohidratos puede consumir en cada comida.  Consuma entre 4 y 6 onzas (oz) de protenas magras por da, como carnes magras, pollo, pescado, huevos o tofu. Una onza de protena magra equivale a: ? 1 onza de carne, pollo o  pescado. ? 1huevo. ?  taza de tofu.  Coma algunos alimentos por da que contengan grasas saludables, como aguacates, frutos secos, semillas y pescado. Estilo de vida  Controle su nivel de glucemia con regularidad.  Haga actividad fsica habitualmente como se lo haya indicado el mdico. Esto puede incluir lo siguiente: ? 150minutos semanales de ejercicio de intensidad moderada o alta. Esto podra incluir caminatas dinmicas, ciclismo o gimnasia acutica. ? Realizar ejercicios de elongacin y de fortalecimiento, como yoga o levantamiento de pesas, por lo menos 2veces por semana.  Tome los medicamentos como se lo haya indicado el mdico.  No consuma ningn producto que contenga nicotina o tabaco, como cigarrillos y cigarrillos electrnicos. Si necesita ayuda para dejar de fumar, consulte al mdico.  Trabaje con un asesor o instructor en diabetes para identificar estrategias para controlar el estrs y cualquier desafo emocional y social. Preguntas para hacerle al mdico  Es necesario que consulte a un instructor en el cuidado de la diabetes?  Es necesario que me rena con un nutricionista?  A qu nmero puedo llamar si tengo preguntas?  Cules son los mejores momentos para controlar la glucemia? Dnde encontrar ms informacin:  Asociacin Estadounidense de la Diabetes (American Diabetes Association): diabetes.org  Academia de Nutricin y Diettica (Academy of Nutrition and Dietetics): www.eatright.org  Instituto Nacional de la Diabetes y las Enfermedades Digestivas y Renales (National Institute of Diabetes and Digestive and Kidney Diseases, NIH): www.niddk.nih.gov Resumen  Un plan de alimentacin saludable lo ayudar a controlar la glucemia y mantener un estilo de vida saludable.    Trabajar con un especialista en dietas y nutricin (nutricionista) puede ayudarlo a elaborar el mejor plan de alimentacin para usted.  Tenga en cuenta que los carbohidratos (hidratos de  carbono) y el alcohol tienen efectos inmediatos en sus niveles de glucemia. Es importante contar los carbohidratos que ingiere y consumir alcohol con prudencia. Esta informacin no tiene como fin reemplazar el consejo del mdico. Asegrese de hacerle al mdico cualquier pregunta que tenga. Document Revised: 10/22/2016 Document Reviewed: 06/03/2016 Elsevier Patient Education  2020 Elsevier Inc.  

## 2020-01-06 NOTE — Progress Notes (Signed)
[facility] [address] [place], [place]  [address] Phone:  [phone]   Fax:  [phone]   New Patient Office Visit  Subjective:  Patient ID: Victor Gonzalez, male    DOB: 1985-09-06  Age: 34 y.o. MRN: [id number]  CC:  Chief Complaint  Patient presents with  . Establish Care    Pt states X2days after he was released from the hospital he broke out in hives from is belly on up X20 days    HPI Recardo Ortega-Tolentino presents for follow up. He  has a past medical history of Hypertension and Thyroid disease. He has not being treated for some time. He has occasional headache and shortness of breath. Denies dizziness, visual changes, dyspnea on exertion, chest pain, nausea, vomiting or any edema.   Prediabetes Patient presents prediabetes Current symptoms include: none. Patient denies foot ulcerations, hyperglycemia, hypoglycemia , increased appetite, nausea, paresthesia of the feet, polydipsia, polyuria, visual disturbances, vomiting and weight loss. Evaluation to date has included: hemoglobin A1C 5.8% one month ago.  Home sugars: patient does not check sugars. Current treatment: none.  Past Medical History:  Diagnosis Date  . Hypertension   . Thyroid disease     Past Surgical History:  Procedure Laterality Date  . SHOULDER SURGERY      History reviewed. No pertinent family history.  Social History   Socioeconomic History  . Marital status: Single    Spouse name: Not on file  . Number of children: Not on file  . Years of education: Not on file  . Highest education level: Not on file  Occupational History  . Not on file  Tobacco Use  . Smoking status: Never Smoker  . Smokeless tobacco: Never Used  Vaping Use  . Vaping Use: Never assessed  Substance and Sexual Activity  . Alcohol use: No  . Drug use: No  . Sexual activity: Never  Other Topics Concern  . Not on file  Social History Narrative   ** Merged History Encounter **        Social Determinants of Health   Financial Resource Strain:   . Difficulty of Paying Living Expenses: Not on file  Food Insecurity:   . Worried About Programme researcher, broadcasting/film/video in the Last Year: Not on file  . Ran Out of Food in the Last Year: Not on file  Transportation Needs:   . Lack of Transportation (Medical): Not on file  . Lack of Transportation (Non-Medical): Not on file  Physical Activity:   . Days of Exercise per Week: Not on file  . Minutes of Exercise per Session: Not on file  Stress:   . Feeling of Stress : Not on file  Social Connections:   . Frequency of Communication with Friends and Family: Not on file  . Frequency of Social Gatherings with Friends and Family: Not on file  . Attends Religious Services: Not on file  . Active Member of Clubs or Organizations: Not on file  . Attends Banker Meetings: Not on file  . Marital Status: Not on file  Intimate Partner Violence:   . Fear of Current or Ex-Partner: Not on file  . Emotionally Abused: Not on file  . Physically Abused: Not on file  . Sexually Abused: Not on file    ROS Review of Systems  Constitutional: Negative.   HENT: Negative.   Eyes: Negative.   Respiratory: Positive for shortness of breath (a little).   Cardiovascular: Negative.  Gastrointestinal: Negative.   Endocrine: Negative.   Genitourinary: Negative.        Urine frequency in the "cold"  Musculoskeletal: Negative.   Skin: Negative.   Allergic/Immunologic: Negative.   Neurological: Positive for headaches (sometimes). Negative for dizziness.    Objective:   Today's Vitals: BP 128/87 (BP Location: Left Arm, Patient Position: Sitting, Cuff Size: Large)   Pulse 74   Temp 97.7 F (36.5 C)   Ht 5\' 5"  (1.651 m)   Wt 211 lb 12.8 oz (96.1 kg)   SpO2 99%   BMI 35.25 kg/m   Physical Exam Exam conducted with a chaperone present.  Constitutional:      Appearance: He is obese.  HENT:     Head: Normocephalic and atraumatic.      Nose: Nose normal.  Cardiovascular:     Rate and Rhythm: Normal rate and regular rhythm.     Pulses: Normal pulses.     Heart sounds: Normal heart sounds.  Pulmonary:     Effort: Pulmonary effort is normal.     Breath sounds: Normal breath sounds.  Abdominal:     General: Bowel sounds are normal.     Palpations: Abdomen is soft.  Musculoskeletal:        General: Normal range of motion.     Cervical back: Normal range of motion.  Skin:    General: Skin is warm and dry.     Capillary Refill: Capillary refill takes less than 2 seconds.  Neurological:     General: No focal deficit present.     Mental Status: He is alert and oriented to person, place, and time.  Psychiatric:        Mood and Affect: Mood normal.        Behavior: Behavior normal.        Thought Content: Thought content normal.        Judgment: Judgment normal.     Assessment & Plan:   Problem List Items Addressed This Visit      Other   Prediabetes Consider home glucose monitoring Weight loss at least 5% of current body weight is can be achieved with lifestyle modification dietary changes and regular daily exercise Encourage blood pressure control goal <120/80 and maintaining total cholesterol <200 Follow-up every 3 to 6 months for reevaluation     Other Visit Diagnoses    Healthcare maintenance    -  Primary   Relevant Orders   Flu Vaccine QUAD 6+ mos PF IM (Fluarix Quad PF)   Establishing care with new doctor, encounter for       Relevant Orders   Urinalysis Dipstick (Completed)   POC HgB A1c (Completed)   POC Glucose (CBG) (Completed)   Personal history of COVID-19          Outpatient Encounter Medications as of 01/06/2020  Medication Sig  . albuterol (VENTOLIN HFA) 108 (90 Base) MCG/ACT inhaler Inhale 2 puffs into the lungs every 6 (six) hours as needed for wheezing or shortness of breath. (Patient not taking: Reported on 01/06/2020)   No facility-administered encounter medications on file as of  01/06/2020.    Follow-up: Return in about 6 months (around 07/05/2020) for medical record release.   09/04/2020, NP

## 2020-01-10 DIAGNOSIS — R7303 Prediabetes: Secondary | ICD-10-CM | POA: Insufficient documentation

## 2020-01-27 ENCOUNTER — Institutional Professional Consult (permissible substitution): Payer: No Typology Code available for payment source | Admitting: Pulmonary Disease

## 2020-02-17 ENCOUNTER — Observation Stay (HOSPITAL_COMMUNITY): Payer: No Typology Code available for payment source

## 2020-02-17 ENCOUNTER — Other Ambulatory Visit: Payer: Self-pay

## 2020-02-17 ENCOUNTER — Institutional Professional Consult (permissible substitution): Payer: No Typology Code available for payment source | Admitting: Pulmonary Disease

## 2020-02-17 ENCOUNTER — Emergency Department (HOSPITAL_COMMUNITY): Payer: No Typology Code available for payment source

## 2020-02-17 ENCOUNTER — Encounter (HOSPITAL_COMMUNITY): Payer: Self-pay | Admitting: *Deleted

## 2020-02-17 ENCOUNTER — Observation Stay (HOSPITAL_COMMUNITY)
Admission: EM | Admit: 2020-02-17 | Discharge: 2020-02-19 | Disposition: A | Discharge status: Home / Self Care | Payer: No Typology Code available for payment source | Attending: Surgery | Admitting: Surgery

## 2020-02-17 ENCOUNTER — Encounter (HOSPITAL_COMMUNITY)
Admission: EM | Disposition: A | Discharge status: Home / Self Care | Payer: Self-pay | Source: Home / Self Care | Attending: Emergency Medicine

## 2020-02-17 DIAGNOSIS — K3580 Unspecified acute appendicitis: Principal | ICD-10-CM | POA: Diagnosis present

## 2020-02-17 DIAGNOSIS — Z8701 Personal history of pneumonia (recurrent): Secondary | ICD-10-CM

## 2020-02-17 DIAGNOSIS — Z20822 Contact with and (suspected) exposure to covid-19: Secondary | ICD-10-CM | POA: Insufficient documentation

## 2020-02-17 DIAGNOSIS — I10 Essential (primary) hypertension: Secondary | ICD-10-CM | POA: Insufficient documentation

## 2020-02-17 LAB — RESP PANEL BY RT-PCR (FLU A&B, COVID) ARPGX2
Influenza A by PCR: NEGATIVE
Influenza B by PCR: NEGATIVE
SARS Coronavirus 2 by RT PCR: NEGATIVE

## 2020-02-17 LAB — CBC
HCT: 45 % (ref 39.0–52.0)
Hemoglobin: 16.5 g/dL (ref 13.0–17.0)
MCH: 32.5 pg (ref 26.0–34.0)
MCHC: 36.7 g/dL — ABNORMAL HIGH (ref 30.0–36.0)
MCV: 88.6 fL (ref 80.0–100.0)
Platelets: 268 10*3/uL (ref 150–400)
RBC: 5.08 MIL/uL (ref 4.22–5.81)
RDW: 12.6 % (ref 11.5–15.5)
WBC: 11.3 10*3/uL — ABNORMAL HIGH (ref 4.0–10.5)
nRBC: 0 % (ref 0.0–0.2)

## 2020-02-17 LAB — COMPREHENSIVE METABOLIC PANEL
ALT: 42 U/L (ref 0–44)
AST: 32 U/L (ref 15–41)
Albumin: 4.3 g/dL (ref 3.5–5.0)
Alkaline Phosphatase: 106 U/L (ref 38–126)
Anion gap: 12 (ref 5–15)
BUN: 15 mg/dL (ref 6–20)
CO2: 22 mmol/L (ref 22–32)
Calcium: 9.6 mg/dL (ref 8.9–10.3)
Chloride: 106 mmol/L (ref 98–111)
Creatinine, Ser: 0.73 mg/dL (ref 0.61–1.24)
GFR, Estimated: >60 mL/min (ref >60)
Glucose, Bld: 144 mg/dL — ABNORMAL HIGH (ref 70–99)
Potassium: 3.7 mmol/L (ref 3.5–5.1)
Sodium: 140 mmol/L (ref 135–145)
Total Bilirubin: 1 mg/dL (ref 0.3–1.2)
Total Protein: 6.6 g/dL (ref 6.5–8.1)

## 2020-02-17 LAB — URINALYSIS, ROUTINE W REFLEX MICROSCOPIC
Bacteria, UA: NONE SEEN
Bilirubin Urine: NEGATIVE
Glucose, UA: NEGATIVE mg/dL
Hgb urine dipstick: NEGATIVE
Ketones, ur: NEGATIVE mg/dL
Leukocytes,Ua: NEGATIVE
Nitrite: NEGATIVE
Protein, ur: NEGATIVE mg/dL
Specific Gravity, Urine: 1.018 (ref 1.005–1.030)
pH: 8 (ref 5.0–8.0)

## 2020-02-17 LAB — LIPASE, BLOOD: Lipase: 26 U/L (ref 11–51)

## 2020-02-17 SURGERY — APPENDECTOMY, LAPAROSCOPIC
Anesthesia: General

## 2020-02-17 MED ORDER — ENOXAPARIN SODIUM 40 MG/0.4ML ~~LOC~~ SOLN
40.0000 mg | SUBCUTANEOUS | Status: DC
  Administered 2020-02-17: 19:00:00 40 mg via SUBCUTANEOUS
  Filled 2020-02-17: qty 0.4

## 2020-02-17 MED ORDER — SODIUM CHLORIDE 0.9 % IV SOLN
2.0000 g | Freq: Once | INTRAVENOUS | Status: AC
  Administered 2020-02-17: 16:00:00 2 g via INTRAVENOUS
  Filled 2020-02-17: qty 20

## 2020-02-17 MED ORDER — CELECOXIB 400 MG PO CAPS
400.0000 mg | ORAL_CAPSULE | ORAL | Status: DC
  Administered 2020-02-17: 18:00:00 400 mg via ORAL
  Filled 2020-02-17 (×2): qty 1

## 2020-02-17 MED ORDER — CELECOXIB 200 MG PO CAPS
400.0000 mg | ORAL_CAPSULE | ORAL | Status: AC
  Filled 2020-02-17 (×2): qty 2

## 2020-02-17 MED ORDER — POLYETHYLENE GLYCOL 3350 17 G PO PACK: 17.0000 g | PACK | Freq: Every day | ORAL | Status: DC | PRN

## 2020-02-17 MED ORDER — DIPHENHYDRAMINE HCL 50 MG/ML IJ SOLN
12.5000 mg | Freq: Four times a day (QID) | INTRAMUSCULAR | Status: DC | PRN
Start: 2020-02-17 — End: 2020-02-19

## 2020-02-17 MED ORDER — ONDANSETRON HCL 4 MG/2ML IJ SOLN: 4.0000 mg | Freq: Four times a day (QID) | INTRAMUSCULAR | Status: DC | PRN

## 2020-02-17 MED ORDER — OXYCODONE HCL 5 MG PO TABS
5.0000 mg | ORAL_TABLET | ORAL | Status: DC | PRN
  Administered 2020-02-19: 10 mg via ORAL
  Filled 2020-02-17: qty 2

## 2020-02-17 MED ORDER — PANTOPRAZOLE SODIUM 40 MG IV SOLR
40.0000 mg | Freq: Every day | INTRAVENOUS | Status: DC
  Administered 2020-02-17 – 2020-02-18 (×2): 40 mg via INTRAVENOUS
  Filled 2020-02-17 (×2): qty 40

## 2020-02-17 MED ORDER — SODIUM CHLORIDE 0.9 % IV SOLN: INTRAVENOUS | Status: DC

## 2020-02-17 MED ORDER — SIMETHICONE 80 MG PO CHEW
40.0000 mg | CHEWABLE_TABLET | Freq: Four times a day (QID) | ORAL | Status: DC | PRN
  Filled 2020-02-17: qty 1

## 2020-02-17 MED ORDER — ONDANSETRON 4 MG PO TBDP: 4.0000 mg | ORAL_TABLET | Freq: Four times a day (QID) | ORAL | Status: DC | PRN

## 2020-02-17 MED ORDER — SODIUM CHLORIDE 0.9 % IV BOLUS
1000.0000 mL | Freq: Once | INTRAVENOUS | Status: AC
  Administered 2020-02-17: 13:00:00 1000 mL via INTRAVENOUS

## 2020-02-17 MED ORDER — CELECOXIB 400 MG PO CAPS: 400.0000 mg | ORAL_CAPSULE | ORAL | Status: DC

## 2020-02-17 MED ORDER — DIPHENHYDRAMINE HCL 12.5 MG/5ML PO ELIX: 12.5000 mg | ORAL_SOLUTION | Freq: Four times a day (QID) | ORAL | Status: DC | PRN

## 2020-02-17 MED ORDER — DICYCLOMINE HCL 20 MG PO TABS: 20.0000 mg | ORAL_TABLET | Freq: Two times a day (BID) | ORAL | 0 refills | Status: DC

## 2020-02-17 MED ORDER — MORPHINE SULFATE (PF) 2 MG/ML IV SOLN
2.0000 mg | INTRAVENOUS | Status: DC | PRN
  Administered 2020-02-18: 2 mg via INTRAVENOUS
  Filled 2020-02-17: qty 1

## 2020-02-17 MED ORDER — ACETAMINOPHEN 500 MG PO TABS
1000.0000 mg | ORAL_TABLET | ORAL | Status: AC
  Administered 2020-02-18: 13:00:00 1000 mg via ORAL
  Filled 2020-02-17: qty 2

## 2020-02-17 MED ORDER — HYDROCODONE-ACETAMINOPHEN 5-325 MG PO TABS
1.0000 | ORAL_TABLET | Freq: Once | ORAL | Status: AC
  Administered 2020-02-17: 06:00:00 1 via ORAL
  Filled 2020-02-17: qty 1

## 2020-02-17 MED ORDER — METRONIDAZOLE IN NACL 5-0.79 MG/ML-% IV SOLN
500.0000 mg | Freq: Once | INTRAVENOUS | Status: AC
  Administered 2020-02-17: 17:00:00 500 mg via INTRAVENOUS
  Filled 2020-02-17: qty 100

## 2020-02-17 MED ORDER — SODIUM CHLORIDE 0.9 % IV SOLN
2.0000 g | INTRAVENOUS | Status: DC
  Administered 2020-02-17 – 2020-02-19 (×2): 2 g via INTRAVENOUS
  Filled 2020-02-17 (×2): qty 2

## 2020-02-17 MED ORDER — GABAPENTIN 300 MG PO CAPS
300.0000 mg | ORAL_CAPSULE | ORAL | Status: AC
  Administered 2020-02-18: 13:00:00 300 mg via ORAL
  Filled 2020-02-17: qty 1

## 2020-02-17 MED ORDER — METRONIDAZOLE 500 MG PO TABS
500.0000 mg | ORAL_TABLET | Freq: Three times a day (TID) | ORAL | Status: DC
  Administered 2020-02-18 – 2020-02-19 (×4): 500 mg via ORAL
  Filled 2020-02-17 (×4): qty 1

## 2020-02-17 MED ORDER — SCOPOLAMINE 1 MG/3DAYS TD PT72
1.0000 | MEDICATED_PATCH | TRANSDERMAL | Status: DC
  Administered 2020-02-18: 13:00:00 1.5 mg via TRANSDERMAL
  Filled 2020-02-17: qty 1

## 2020-02-17 MED ORDER — DOCUSATE SODIUM 100 MG PO CAPS
100.0000 mg | ORAL_CAPSULE | Freq: Two times a day (BID) | ORAL | Status: DC
  Administered 2020-02-17 – 2020-02-19 (×3): 100 mg via ORAL
  Filled 2020-02-17 (×3): qty 1

## 2020-02-17 MED ORDER — METOPROLOL TARTRATE 5 MG/5ML IV SOLN: 5.0000 mg | Freq: Four times a day (QID) | INTRAVENOUS | Status: DC | PRN

## 2020-02-17 MED ORDER — IOHEXOL 300 MG/ML  SOLN
100.0000 mL | Freq: Once | INTRAMUSCULAR | Status: AC | PRN
  Administered 2020-02-17: 100 mL via INTRAVENOUS

## 2020-02-17 MED ORDER — METHOCARBAMOL 500 MG PO TABS: 500.0000 mg | ORAL_TABLET | Freq: Four times a day (QID) | ORAL | Status: DC | PRN

## 2020-02-17 MED ORDER — ACETAMINOPHEN 500 MG PO TABS
1000.0000 mg | ORAL_TABLET | Freq: Three times a day (TID) | ORAL | Status: DC | PRN
  Administered 2020-02-19: 1000 mg via ORAL
  Filled 2020-02-17: qty 2

## 2020-02-17 MED ORDER — FENTANYL CITRATE (PF) 100 MCG/2ML IJ SOLN: 50.0000 ug | Freq: Once | INTRAMUSCULAR | Status: DC

## 2020-02-17 NOTE — ED Notes (Signed)
Meal tray ordered for pt  

## 2020-02-17 NOTE — ED Triage Notes (Signed)
C/o abd. Pain onset 3pm yest denies vomiting,

## 2020-02-17 NOTE — ED Provider Notes (Addendum)
[name] EMERGENCY DEPARTMENT Provider Note   CSN: [id number] Arrival date & time: 02/17/20  3220     History Chief Complaint  Patient presents with  . Abdominal Pain    Victor Gonzalez is a 34 y.o. male with a past medical history of hypertension presenting to the ED with a chief complaint of abdominal pain.  Reports right lower quadrant and suprapubic abdominal pain for the past 21 hours.  Denies any nausea, vomiting or diarrhea.  Cannot recall a specific trigger that would cause his symptoms, does not believe that is related to p.o. intake.  He denies sick contacts with similar symptoms.  He has not taken any medication stop with his symptoms.  No prior abdominal surgeries.  No chest pain, shortness of breath, cough, bloody stools, urinary symptoms.  Reports occasional alcohol use.  Denies drug use. History is provided via a medical Spanish interpreter.  HPI     Past Medical History:  Diagnosis Date  . Hypertension   . Thyroid disease     Patient Active Problem List   Diagnosis Date Noted  . Prediabetes 01/10/2020  . Elevated liver enzymes 12/27/2019  . Shortness of breath 12/27/2019  . Cough 12/06/2019  . Rash 12/06/2019  . Pneumonia due to COVID-19 virus 11/19/2019  . COVID-19 virus infection 11/19/2019    Past Surgical History:  Procedure Laterality Date  . SHOULDER SURGERY         No family history on file.  Social History   Tobacco Use  . Smoking status: Never Smoker  . Smokeless tobacco: Never Used  Substance Use Topics  . Alcohol use: No  . Drug use: No    Home Medications Prior to Admission medications   Medication Sig Start Date End Date Taking? Authorizing Provider  acetaminophen (TYLENOL) 500 MG tablet Take 500-1,000 mg by mouth as needed (pain).   Yes [provider]  albuterol (VENTOLIN HFA) 108 (90 Base) MCG/ACT inhaler Inhale 2 puffs into the lungs every 6 (six) hours as needed for wheezing or  shortness of breath. Patient not taking: No sig reported 11/25/19   Leroy Sea, MD  dicyclomine (BENTYL) 20 MG tablet Take 1 tablet (20 mg total) by mouth 2 (two) times daily. 02/17/20   Dietrich Pates, PA-C    Allergies    Patient has no known allergies.  Review of Systems   Review of Systems  Constitutional: Negative for appetite change, chills and fever.  HENT: Negative for ear pain, rhinorrhea, sneezing and sore throat.   Eyes: Negative for photophobia and visual disturbance.  Respiratory: Negative for cough, chest tightness, shortness of breath and wheezing.   Cardiovascular: Negative for chest pain and palpitations.  Gastrointestinal: Positive for abdominal pain. Negative for blood in stool, constipation, diarrhea, nausea and vomiting.  Genitourinary: Negative for dysuria, hematuria and urgency.  Musculoskeletal: Negative for myalgias.  Skin: Negative for rash.  Neurological: Negative for dizziness, weakness and light-headedness.    Physical Exam Updated Vital Signs BP 129/73 (BP Location: Left Arm)   Pulse 61   Temp 98.2 F (36.8 C)   Resp 16   SpO2 98%   Physical Exam Vitals and nursing note reviewed.  Constitutional:      General: He is not in acute distress.    Appearance: He is well-developed and well-nourished.  HENT:     Head: Normocephalic and atraumatic.     Nose: Nose normal.  Eyes:     General: No scleral icterus.  Left eye: No discharge.     Extraocular Movements: EOM normal.     Conjunctiva/sclera: Conjunctivae normal.  Cardiovascular:     Rate and Rhythm: Normal rate and regular rhythm.     Pulses: Intact distal pulses.     Heart sounds: Normal heart sounds. No murmur heard. No friction rub. No gallop.   Pulmonary:     Effort: Pulmonary effort is normal. No respiratory distress.     Breath sounds: Normal breath sounds.  Abdominal:     General: Bowel sounds are normal. There is no distension.     Palpations: Abdomen is soft.      Tenderness: There is abdominal tenderness in the right lower quadrant and suprapubic area. There is no guarding.  Musculoskeletal:        General: No edema. Normal range of motion.     Cervical back: Normal range of motion and neck supple.  Skin:    General: Skin is warm and dry.     Findings: No rash.  Neurological:     Mental Status: He is alert.     Motor: No abnormal muscle tone.     Coordination: Coordination normal.  Psychiatric:        Mood and Affect: Mood and affect normal.     ED Results / Procedures / Treatments   Labs (all labs ordered are listed, but only abnormal results are displayed) Labs Reviewed  COMPREHENSIVE METABOLIC PANEL - Abnormal; Notable for the following components:      Result Value   Glucose, Bld 144 (*)    All other components within normal limits  CBC - Abnormal; Notable for the following components:   WBC 11.3 (*)    MCHC 36.7 (*)    All other components within normal limits  URINALYSIS, ROUTINE W REFLEX MICROSCOPIC - Abnormal; Notable for the following components:   APPearance TURBID (*)    All other components within normal limits  RESP PANEL BY RT-PCR (FLU A&B, COVID) ARPGX2  LIPASE, BLOOD    EKG None  Radiology CT ABDOMEN PELVIS W CONTRAST  Result Date: 02/17/2020 CLINICAL DATA:  Right lower quadrant pain, nausea and vomiting since yesterday EXAM: CT ABDOMEN AND PELVIS WITH CONTRAST TECHNIQUE: Multidetector CT imaging of the abdomen and pelvis was performed using the standard protocol following bolus administration of intravenous contrast. CONTRAST:  OMNIPAQUE IOHEXOL 300 MG/ML  SOLN COMPARISON:  05/24/2010 FINDINGS: Lower chest: No acute pleural or parenchymal lung disease. Hepatobiliary: Mild diffuse hepatic steatosis. No focal liver abnormalities. The gallbladder is unremarkable. Pancreas: Unremarkable. No pancreatic ductal dilatation or surrounding inflammatory changes. Spleen: Normal in size without focal abnormality.  Adrenals/Urinary Tract: 3.6 cm simple cyst lateral margin left kidney. Otherwise the kidneys enhance normally and symmetrically. No urinary tract calculi or obstruction. Bladder and adrenals are unremarkable. Stomach/Bowel: There is a dilated retrocecal appendix measuring 14 mm in diameter, with mild periappendiceal fat stranding. Findings are consistent with acute uncomplicated appendicitis. No perforation, fluid collection, or abscess. No evidence of appendicoliths. No bowel obstruction or ileus. Vascular/Lymphatic: No significant vascular findings are present. No enlarged abdominal or pelvic lymph nodes. Multiple small lymph nodes in the right lower quadrant mesentery are likely reactive given the adjacent appendicitis. Reproductive: Prostate is unremarkable. Other: No free fluid or free gas.  No abdominal wall hernia. Musculoskeletal: No acute or destructive bony lesions. Reconstructed images demonstrate no additional findings. IMPRESSION: 1. Acute uncomplicated appendicitis. No perforation, fluid collection, or abscess. 2. Mild hepatic steatosis. Electronically Signed   By:  Sharlet Salina M.D.   On: 02/17/2020 15:20    Procedures Procedures (including critical care time)  Medications Ordered in ED Medications  fentaNYL (SUBLIMAZE) injection 50 mcg (has no administration in time range)  cefTRIAXone (ROCEPHIN) 2 g in sodium chloride 0.9 % 100 mL IVPB (has no administration in time range)    And  metroNIDAZOLE (FLAGYL) IVPB 500 mg (has no administration in time range)  HYDROcodone-acetaminophen (NORCO/VICODIN) 5-325 MG per tablet 1 tablet (1 tablet Oral Given 02/17/20 0612)  sodium chloride 0.9 % bolus 1,000 mL (1,000 mLs Intravenous New Bag/Given 02/17/20 1250)  iohexol (OMNIPAQUE) 300 MG/ML solution 100 mL (100 mLs Intravenous Contrast Given 02/17/20 1516)    ED Course  I have reviewed the triage vital signs and the nursing notes.  Pertinent labs & imaging results that were available during  my care of the patient were reviewed by me and considered in my medical decision making (see chart for details).    MDM Rules/Calculators/A&P                          34 year old male with past medical history of hypertension presenting to the ED with a chief complaint of abdominal pain.  Right lower quadrant and suprapubic abdominal pain for the past 21 hours.  No vomiting or changes to bowel movements or urination.  Has not take any medications to help with the symptoms.  No cough, chest pain, shortness of breath.  On exam there is tenderness of the right lower quadrant and suprapubic area of the abdomen without rebound or guarding.  He is not vomiting.  Vital signs are within normal limits.  Urinalysis without evidence of infection.  CMP, lipase unremarkable, CBC with a very slight leukocytosis.  Due to location of pain will need CT to rule out appendicitis versus other structural cause of his symptoms.  In the meantime we will attempt to treat pain.  3:25 PM CT scan shows acute uncomplicated appendicitis.  Will order antibiotics, call surgery and obtain Covid test for preop. Patient informed of CT findings and disposition. NPO since yesterday.   Portions of this note were generated with Scientist, clinical (histocompatibility and immunogenetics). Dictation errors may occur despite best attempts at proofreading.  Final Clinical Impression(s) / ED Diagnoses Final diagnoses:  Acute appendicitis, unspecified acute appendicitis type    Rx / DC Orders     Dietrich Pates, PA-C 02/17/20 1530    Arby Barrette, MD 02/17/20 1538

## 2020-02-17 NOTE — Discharge Instructions (Addendum)
Puede empezar a llover 25/01/2020. No despegue ni frote el pegamento para la piel. Puede permitir que el agua tibia con jabn corra sobre la incisin, luego enjuague y seque. No se sumerja en agua (baeras, jacuzzis, piscinas, lagos, ocanos) durante [employer].  No levantar ms de 5 libras durante seis semanas.  Llame a la oficina al [phone] si la temperatura es superior a 101.5 F, empeora el dolor, el enrojecimiento o el calor en el sitio de la incisin.  Por favor, llame al [phone] para hacer una cita durante 1-2 semanas despus de la ciruga para la evaluacin de la herida.    Apendicitis, en los adultos Appendicitis, Adult  El apndice es un tubo cilndrico en el cuerpo que tiene forma de dedo. Est conectado al intestino grueso. El trmino apendicitis significa que este tubo cilndrico est hinchado (inflamado). Si no se trata, el tubo se puede desgarrar (ruptura). Esto puede derivar en una infeccin potencialmente mortal. Esta afeccin tambin puede causar la acumulacin de pus en el apndice (absceso). Cules son las causas? Esta afeccin puede deberse a algo que obstruye el apndice. Esto incluye lo siguiente:  Una masa de materia fecal.  Ganglios linfticos ms grandes que lo normal. Algunas veces, la causa no se conoce. Qu incrementa el riesgo? Es ms probable que presente esta afeccin si tiene entre 10 y 30 aos de edad. Cules son los signos o los sntomas? Los sntomas de esta afeccin incluyen:  Dolor alrededor del ombligo. ? El dolor se traslada hacia la parte inferior derecha del vientre (abdomen). ? El dolor puede empeorar con el [place]. ? El dolor puede empeorar al toser. ? El dolor puede empeorar al moverse bruscamente.  Dolor a la palpacin en la zona inferior derecha del abdomen.  Ganas de vomitar (nuseas).  Vmitos.  Falta de apetito (inapetencia).  Grant Ruts.  Tener dificultad para defecar (estreimiento).  Materia fecal lquida  (diarrea).  Malestar general. Cmo se trata? En la [employer], [place] afeccin se trata mediante la extirpacin del apndice (apendicectoma). Hay dos modos de Education officer, environmental esto:  [place]. Con este mtodo, el apndice se extirpa a travs de un corte grande (incisin). El corte se realiza en la zona inferior derecha del abdomen. Esta ciruga se puede utilizar si: ? Tiene cicatrices de [place]. ? Tiene una enfermedad hemorrgica. ? Est embarazada, y el beb nacer pronto. ? Tiene una enfermedad que no dificulta realizar el otro tipo de [place].  Ciruga laparoscpica. Con este mtodo, el apndice se extirpa a travs de pequeos cortes. A menudo, esta ciruga: ? Arts administrator. ? [employer]. ? La recuperacin es ms fcil. Si se produce la ruptura del apndice y se forma pus:  Pueden colocarle un drenaje en la llaga. El drenaje se usar para eliminar el pus.  Pueden administrarle un antibitico a travs de una va intravenosa (i.v.).  La extirpacin del apndice puede o no ser necesaria. Siga estas indicaciones en su casa: Si se someti a [place], siga las indicaciones del mdico sobre cmo cuidarse en su casa y cmo cuidar el corte de la [place]. Medicamentos  [employer] de venta libre y los recetados solamente como se lo haya indicado el mdico.  Si le recetaron un antibitico, tmelo como se lo haya indicado el mdico. No deje de tomar el antibitico aunque comience a sentirse mejor. Comida y bebida Siga las indicaciones del mdico respecto de lo que no puede comer o beber. Puede retornar lentamente a su  dieta si:  Ya no siente Journalist, newspaper.  Ha dejado de vomitar. Indicaciones generales  No consuma ningn producto que contenga nicotina o tabaco, como cigarrillos, cigarrillos electrnicos y tabaco de Theatre manager. Si necesita ayuda para dejar de fumar, consulte al mdico.  No conduzca ni use maquinaria pesada mientras toma  analgsicos recetados.  Consulte al mdico si el medicamento que est tomando puede causarle problemas para defecar. Es posible que deba tomar medidas para prevenir o tratar los problemas para defecar: ? Beba suficiente lquido para Radio producer pis (la orina) de color amarillo plido. ? Tome medicamentos recetados o de venta [place]. ? Coma alimentos ricos en fibra. Entre ellos, frijoles, cereales integrales y frutas y verduras frescas. ? Limite los alimentos con alto contenido de [place] y International aid/development worker. Estos incluyen alimentos fritos o dulces.  Concurra a todas las visitas de control como se lo haya indicado el mdico. Esto es importante. Comunquese con un mdico si:  Observa pus, sangre o una gran cantidad de lquido que sale del corte o de los cortes de la [place].  Siente Journalist, newspaper o vomita. Solicite ayuda inmediatamente si:  Scientific laboratory technician, y Community education officer.  Tiene fiebre.  Tiene escalofros.  Se siente muy cansado.  Siente dolores musculares.  Le falta el aire. Resumen  La apendicitis es la inflamacin del apndice. El apndice es un tubo cilndrico que tiene forma de dedo. Est conectado al intestino grueso.  Esta afeccin puede deberse a algo que obstruye el apndice. Esto puede causar una infeccin.  Generalmente, esta afeccin se trata mediante la extirpacin del apndice. Esta informacin no tiene Theme park manager el consejo del mdico. Asegrese de hacerle al mdico cualquier pregunta que tenga. Document Revised: 09/22/2017 Document Reviewed: 09/22/2017 Elsevier Patient Education  2020 Elsevier Inc.   Apendicectoma laparoscpica en adultos, cuidados posteriores Laparoscopic Appendectomy, Adult, Care After Esta hoja le brinda informacin sobre cmo cuidarse despus del procedimiento. El mdico tambin podr darle indicaciones ms especficas. Si tiene problemas o preguntas, llame al mdico. Qu puedo esperar despus del  procedimiento? Despus del procedimiento, es comn [employer]V siguientes sntomas:  Poca energa para Education officer, environmental las [address].  Dolor leve en la zona donde se realizaron los cortes para la ciruga (incisiones).  Dificultad para defecar (estreimiento). Esto puede ser consecuencia de: ? Analgsicos. ? Falta de [place]. Siga estas indicaciones en su casa: Medicamentos  [employer] International de venta libre y los recetados solamente como se lo haya indicado el mdico.  Si le recetaron un antibitico, tmelo como se lo haya indicado el mdico. No deje de tomarlo aunque comience a sentirse mejor.  No conduzca ni use maquinaria pesada mientras toma analgsicos recetados.  Consulte a su mdico si el medicamento que toma puede causarle problemas para defecar. Es posible que deba tomar medidas para prevenir o tratar los problemas para defecar: ? Beba suficiente lquido para Radio producer pis (la orina) de color amarillo plido. ? Tome medicamentos recetados o de venta [place]. ? Coma alimentos ricos en fibra. Entre ellos, frijoles, cereales integrales y frutas y verduras frescas. ? Limite los alimentos con alto contenido de [place] y International aid/development worker. Estos incluyen alimentos fritos o dulces. Cuidado de la incisin   Siga las indicaciones del mdico en lo que respecta al cuidado de los cortes de la [place]. Asegrese de hacer lo siguiente: ? Lvese las manos con agua y [place] antes y despus de cambiar la venda (vendaje). Use un desinfectante para manos si  no dispone de [place] y [place]. ? Cambie las [employer] se lo haya indicado el mdico. ? No retire los puntos (suturas), la goma para cerrar la piel o las tiras de cinta [place]). Tal vez deban dejarse puestos en la piel durante 2semanas o ms. Si las tiras [place] se despegan y se enroscan, puede recortar los bordes sueltos. No retire las tiras [employer] por completo a menos que el mdico lo autorice.  Controle los cortes de la ciruga todos los  [address]  [address] para detectar signos de infeccin. Est atento a los siguientes signos: ? Enrojecimiento, hinchazn o dolor. ? Lquido o sangre. ? Calor. ? Pus o mal olor. Baarse  Mantenga los cortes de la ciruga limpios y secos. Lmpielos como se lo haya indicado el mdico. Para hacer esto: 1. Lave los cortes suavemente con agua y [place]. 2. Enjuague los cortes con agua para quitar todo el [place]. 3. Seque bien los cortes con una toalla limpia dando golpecitos. No frote [employer] cortes.  No tome baos de inmersin, no nade ni use un jacuzzi durante 2 semanas o hasta que el mdico lo autorice. Puede tomar una ducha despus de 48horas. Actividad   No conduzca durante 24horas si recibi un medicamento para ayudarlo a relajarse (sedante) durante el procedimiento.  Descanse despus del procedimiento. Retome sus actividades habituales como se lo haya indicado el mdico. Pregntele al mdico qu actividades son seguras para usted.  Durante 3semanas o el tiempo que le haya indicado el mdico: ? No levante objetos que pesen ms de 10libras (4,5kg) o que sean ms pesados de lo que le indicaron. ? No practique deportes de contacto. Indicaciones generales  Si lo enviaron a su casa con un drenaje, siga las indicaciones del mdico en lo que respecta al cuidado.  Haga respiraciones profundas. Esto ayuda a prevenir una [employer] pulmones (neumona).  Concurra a todas las visitas de [address] se lo haya indicado el mdico. Esto es importante. Comunquese con un mdico si:  Tiene enrojecimiento, hinchazn o dolor alrededor de un corte de la ciruga.  Observa lquido o [employer] de uno de los cortes.  El corte est caliente al tacto.  Tiene pus o un olor ftido que provienen de uno de los cortes o de un vendaje.  Se abren los bordes de uno de los cortes despus de que se retiraron los puntos.  Tiene dolor en los hombros que [place].  Se siente mareado o se desvanece (se  desmaya).  Le falta el aire.  Siente malestar estomacal (nuseas) constante.  No deja de vomitar.  Tiene heces lquidas (diarrea) o no puede controlar la defecacin.  Pierde el apetito.  Presenta dolor o hinchazn en las piernas.  Aparece una erupcin cutnea. Solicite ayuda inmediatamente si:  Tiene fiebre.  Tiene dificultad para respirar.  Siente dolores fuertes [employer]. Resumen  Despus del procedimiento, es comn tener poca energa, dolor leve y dificultad para defecar.  La infeccin es un problema frecuente despus de este procedimiento. Siga las indicaciones de su mdico acerca de cmo cuidarse despus del procedimiento.  Descanse despus del procedimiento. Retome sus actividades habituales como se lo haya indicado el mdico.  Comunquese con su mdico si observa signos de infeccin alrededor [employer] cortes de la ciruga o si le falta el aire. [place] ayuda de inmediato si tiene fiebre, dolor de pecho o problemas para Industrial/product designer. Esta informacin no tiene Theme park manager el consejo del mdico. Asegrese de hacerle al mdico cualquier  pregunta que tenga. Document Revised: 10/13/2017 Document Reviewed: 10/13/2017 Elsevier Patient Education  2020 [employer]. Take the [place] as needed to help with symptoms.  Follow up with your primary care provider or the 1 listed below. Make sure you are drinking plenty of fluids and advancing her diet as tolerated. Return to the ER if you start to experience worsening pain, shortness of breath, bloody stools or fever.   Tome Bentyl segn sea necesario para aliviar los sntomas. Haga un seguimiento con su proveedor de atencin primaria o con el 1 que se enumera a continuacin. Asegrese de beber muchos lquidos y de Biomedical scientisthacer avanzar su dieta segn lo tolere. Regrese a la sala de emergencias si comienza a experimentar un empeoramiento del dolor, dificultad para respirar, heces con sangre o fiebre.

## 2020-02-17 NOTE — ED Notes (Signed)
Attempted to call report °

## 2020-02-17 NOTE — H&P (Signed)
[facility] Admission Note  Victor Gonzalez 1985-10-15  [id number].    Requesting MD: Arby Barrette Chief Complaint/Reason for Consult: appendicitis  HPI:  Victor Gonzalez is a 34yo PMH HTN and pre-DM (Not on medication), recent covid pneumonia 10/2019 requiring hospitalization who presented to [facility] today complaining of acute onset abdominal pain. States that it started yesterday afternoon. Pain is in his lower abdomen. Pain gradually became worse and more generalized so he decided to come to the ED. He does also report some chills. Denies fever, nausea, vomiting, diarrhea, cough, SOB.  In the ED he had a CT scan that showed acute uncomplicated appendicitis; no perforation, fluid collection, or abscess. WBC 11.3. General surgery asked to see.  He has had nothing to eat/drink today.  Abdominal surgical history: none Anticoagulants: none Nonsmoker Drinks alcohol occassionally Employment: roofing Lives at home alone  Review of Systems  Constitutional: Positive for chills. Negative for fever.  HENT: Negative.   Eyes: Negative.   Respiratory: Negative.   Cardiovascular: Negative.   Gastrointestinal: Positive for abdominal pain. Negative for diarrhea, nausea and vomiting.  Genitourinary: Negative.   Musculoskeletal: Negative.   Skin: Negative.   Neurological: Negative.     All systems reviewed and otherwise negative except for as above  No family history on file.  Past Medical History:  Diagnosis Date  . Hypertension   . Thyroid disease     Past Surgical History:  Procedure Laterality Date  . SHOULDER SURGERY      Social History:  reports that he has never smoked. He has never used smokeless tobacco. He reports that he does not drink alcohol and does not use drugs.  Allergies: No Known Allergies  (Not in a hospital admission)   Prior to Admission medications   Medication Sig Start Date End Date Taking? Authorizing Provider   acetaminophen (TYLENOL) 500 MG tablet Take 500-1,000 mg by mouth as needed (pain).   Yes [provider]  albuterol (VENTOLIN HFA) 108 (90 Base) MCG/ACT inhaler Inhale 2 puffs into the lungs every 6 (six) hours as needed for wheezing or shortness of breath. Patient not taking: No sig reported 11/25/19   Leroy Sea, MD    Blood pressure 134/80, pulse 64, temperature 98.2 F (36.8 C), resp. rate 16, SpO2 98 %. Physical Exam: General: pleasant, WD/WN male who is laying in bed in NAD HEENT: head is normocephalic, atraumatic.  Sclera are noninjected.  PERRL.  Ears and nose without any masses or lesions.  Mouth is pink and moist. Dentition fair Heart: regular, rate, and rhythm.  Normal s1,s2. No obvious murmurs, gallops, or rubs noted.  Palpable pedal pulses bilaterally  Lungs: CTAB, no wheezes, rhonchi, or rales noted.  Respiratory effort nonlabored Abd: soft, ND, +BS, no masses, hernias, or organomegaly. TTP suprapubic region without rebound or guarding, no peritonitis MS: no BUE/BLE edema, calves soft and nontender Skin: warm and dry with no masses, lesions, or rashes Psych: A&Ox4 with an appropriate affect Neuro: cranial nerves grossly intact, equal strength in BUE/BLE bilaterally, normal speech, thought process intact  Results for orders placed or performed during the hospital encounter of 02/17/20 (from the past 48 hour(s))  Urinalysis, Routine w reflex microscopic Urine, Clean Catch     Status: Abnormal   Collection Time: 02/17/20  5:53 AM  Result Value Ref Range   Color, Urine YELLOW YELLOW   APPearance TURBID (A) CLEAR   Specific Gravity, Urine 1.018 1.005 - 1.030   pH 8.0 5.0 - 8.0  Glucose, UA NEGATIVE NEGATIVE mg/dL   Hgb urine dipstick NEGATIVE NEGATIVE   Bilirubin Urine NEGATIVE NEGATIVE   Ketones, ur NEGATIVE NEGATIVE mg/dL   Protein, ur NEGATIVE NEGATIVE mg/dL   Nitrite NEGATIVE NEGATIVE   Leukocytes,Ua NEGATIVE NEGATIVE   RBC / HPF 0-5 0 - 5 RBC/hpf    WBC, UA 0-5 0 - 5 WBC/hpf   Bacteria, UA NONE SEEN NONE SEEN   Amorphous Crystal PRESENT     Comment: Performed at [facility], [address], [place], [place] [address]  Lipase, blood     Status: None   Collection Time: 02/17/20  5:59 AM  Result Value Ref Range   Lipase 26 11 - 51 U/L    Comment: Performed at [facility], [address] [place], [place] [address]  Comprehensive metabolic panel     Status: Abnormal   Collection Time: 02/17/20  5:59 AM  Result Value Ref Range   Sodium 140 135 - 145 mmol/L   Potassium 3.7 3.5 - 5.1 mmol/L   Chloride 106 98 - 111 mmol/L   CO2 22 22 - 32 mmol/L   Glucose, Bld 144 (H) 70 - 99 mg/dL    Comment: Glucose reference range applies only to samples taken after fasting for at least 8 hours.   BUN 15 6 - 20 mg/dL   Creatinine, Ser 2.75 0.61 - 1.24 mg/dL   Calcium 9.6 8.9 - 17.0 mg/dL   Total Protein 6.6 6.5 - 8.1 g/dL   Albumin 4.3 3.5 - 5.0 g/dL   AST 32 15 - 41 U/L   ALT 42 0 - 44 U/L   Alkaline Phosphatase 106 38 - 126 U/L   Total Bilirubin 1.0 0.3 - 1.2 mg/dL   GFR, Estimated >01 >74 mL/min    Comment: (NOTE) Calculated using the CKD-EPI Creatinine Equation (2021)    Anion gap 12 5 - 15    Comment: Performed at [facility], [address], [place], [place] [address]  CBC     Status: Abnormal   Collection Time: 02/17/20  5:59 AM  Result Value Ref Range   WBC 11.3 (H) 4.0 - 10.5 K/uL   RBC 5.08 4.22 - 5.81 MIL/uL   Hemoglobin 16.5 13.0 - 17.0 g/dL   HCT 75.9 16.3 - 84.6 %   MCV 88.6 80.0 - 100.0 fL   MCH 32.5 26.0 - 34.0 pg   MCHC 36.7 (H) 30.0 - 36.0 g/dL   RDW 65.9 93.5 - 70.1 %   Platelets 268 150 - 400 K/uL   nRBC 0.0 0.0 - 0.2 %    Comment: Performed at [facility], [address], [place], [place] [address]   CT ABDOMEN PELVIS W CONTRAST  Result Date: 02/17/2020 CLINICAL DATA:  Right lower quadrant pain, nausea and vomiting since yesterday EXAM: CT ABDOMEN AND PELVIS WITH CONTRAST TECHNIQUE:  Multidetector CT imaging of the abdomen and pelvis was performed using the standard protocol following bolus administration of intravenous contrast. CONTRAST:  OMNIPAQUE IOHEXOL 300 MG/ML  SOLN COMPARISON:  05/24/2010 FINDINGS: Lower chest: No acute pleural or parenchymal lung disease. Hepatobiliary: Mild diffuse hepatic steatosis. No focal liver abnormalities. The gallbladder is unremarkable. Pancreas: Unremarkable. No pancreatic ductal dilatation or surrounding inflammatory changes. Spleen: Normal in size without focal abnormality. Adrenals/Urinary Tract: 3.6 cm simple cyst lateral margin left kidney. Otherwise the kidneys enhance normally and symmetrically. No urinary tract calculi or obstruction. Bladder and adrenals are unremarkable. Stomach/Bowel: There is  a dilated retrocecal appendix measuring 14 mm in diameter, with mild periappendiceal fat stranding. Findings are consistent with acute uncomplicated appendicitis. No perforation, fluid collection, or abscess. No evidence of appendicoliths. No bowel obstruction or ileus. Vascular/Lymphatic: No significant vascular findings are present. No enlarged abdominal or pelvic lymph nodes. Multiple small lymph nodes in the right lower quadrant mesentery are likely reactive given the adjacent appendicitis. Reproductive: Prostate is unremarkable. Other: No free fluid or free gas.  No abdominal wall hernia. Musculoskeletal: No acute or destructive bony lesions. Reconstructed images demonstrate no additional findings. IMPRESSION: 1. Acute uncomplicated appendicitis. No perforation, fluid collection, or abscess. 2. Mild hepatic steatosis. Electronically Signed   By: Sharlet Salina M.D.   On: 02/17/2020 15:20    Anti-infectives (From admission, onward)   Start     Dose/Rate Route Frequency Ordered Stop   02/17/20 1530  cefTRIAXone (ROCEPHIN) 2 g in sodium chloride 0.9 % 100 mL IVPB       "And" Linked Group Details   2 g 200 mL/hr over 30 Minutes Intravenous   Once 02/17/20 1524     02/17/20 1530  metroNIDAZOLE (FLAGYL) IVPB 500 mg       "And" Linked Group Details   500 mg 100 mL/hr over 60 Minutes Intravenous  Once 02/17/20 1524         Assessment/Plan HTN Pre-DM Hx covid pneumonia 10/2019  Acute appendicitis - Patient with clinical and radiographic findings consistent with acute appendicitis. Pending covid test, plan for laparoscopic appendectomy. Surgery tonight pending OR availability. Start IV rocephin/flagyl. Keep NPO.   ID - rocephin/flagyl 12/23>> VTE - SCDs, lovenox FEN - IVF, NPO Foley - none Follow up - TBD  Franne Forts, [facility] 02/17/2020, 4:43 PM Please see Amion for pager number during day hours 7:00am-4:30pm

## 2020-02-18 ENCOUNTER — Observation Stay (HOSPITAL_COMMUNITY): Payer: No Typology Code available for payment source | Admitting: Certified Registered Nurse Anesthetist

## 2020-02-18 ENCOUNTER — Encounter (HOSPITAL_COMMUNITY)
Admission: EM | Disposition: A | Discharge status: Home / Self Care | Payer: Self-pay | Source: Home / Self Care | Attending: Emergency Medicine

## 2020-02-18 ENCOUNTER — Encounter (HOSPITAL_COMMUNITY): Payer: Self-pay

## 2020-02-18 HISTORY — PX: LAPAROSCOPIC APPENDECTOMY: SHX408

## 2020-02-18 LAB — BASIC METABOLIC PANEL
Anion gap: 9 (ref 5–15)
BUN: 17 mg/dL (ref 6–20)
CO2: 23 mmol/L (ref 22–32)
Calcium: 8.4 mg/dL — ABNORMAL LOW (ref 8.9–10.3)
Chloride: 108 mmol/L (ref 98–111)
Creatinine, Ser: 0.84 mg/dL (ref 0.61–1.24)
GFR, Estimated: >60 mL/min (ref >60)
Glucose, Bld: 132 mg/dL — ABNORMAL HIGH (ref 70–99)
Potassium: 3.9 mmol/L (ref 3.5–5.1)
Sodium: 140 mmol/L (ref 135–145)

## 2020-02-18 LAB — CBC
HCT: 40.5 % (ref 39.0–52.0)
Hemoglobin: 14.5 g/dL (ref 13.0–17.0)
MCH: 32.5 pg (ref 26.0–34.0)
MCHC: 35.8 g/dL (ref 30.0–36.0)
MCV: 90.8 fL (ref 80.0–100.0)
Platelets: 224 10*3/uL (ref 150–400)
RBC: 4.46 MIL/uL (ref 4.22–5.81)
RDW: 12.7 % (ref 11.5–15.5)
WBC: 5.6 10*3/uL (ref 4.0–10.5)
nRBC: 0 % (ref 0.0–0.2)

## 2020-02-18 LAB — SURGICAL PCR SCREEN
MRSA, PCR: NEGATIVE
Staphylococcus aureus: NEGATIVE

## 2020-02-18 LAB — GLUCOSE, CAPILLARY: Glucose-Capillary: 102 mg/dL — ABNORMAL HIGH (ref 70–99)

## 2020-02-18 SURGERY — APPENDECTOMY, LAPAROSCOPIC
Anesthesia: General | Site: Abdomen

## 2020-02-18 MED ORDER — FENTANYL CITRATE (PF) 250 MCG/5ML IJ SOLN
INTRAMUSCULAR | Status: DC | PRN
  Administered 2020-02-18 (×3): 50 ug via INTRAVENOUS
  Administered 2020-02-18: 100 ug via INTRAVENOUS
  Administered 2020-02-18: 50 ug via INTRAVENOUS

## 2020-02-18 MED ORDER — EPHEDRINE 5 MG/ML INJ
INTRAVENOUS | Status: AC
  Filled 2020-02-18: qty 10

## 2020-02-18 MED ORDER — KETOROLAC TROMETHAMINE 30 MG/ML IJ SOLN
INTRAMUSCULAR | Status: AC
  Filled 2020-02-18: qty 1

## 2020-02-18 MED ORDER — FENTANYL CITRATE (PF) 250 MCG/5ML IJ SOLN
INTRAMUSCULAR | Status: AC
  Filled 2020-02-18: qty 5

## 2020-02-18 MED ORDER — 0.9 % SODIUM CHLORIDE (POUR BTL) OPTIME
TOPICAL | Status: DC | PRN
  Administered 2020-02-18: 14:00:00 1000 mL

## 2020-02-18 MED ORDER — BUPIVACAINE HCL (PF) 0.25 % IJ SOLN
INTRAMUSCULAR | Status: AC
  Filled 2020-02-18: qty 30

## 2020-02-18 MED ORDER — ROCURONIUM BROMIDE 10 MG/ML (PF) SYRINGE
PREFILLED_SYRINGE | INTRAVENOUS | Status: DC | PRN
  Administered 2020-02-18: 50 mg via INTRAVENOUS
  Administered 2020-02-18: 30 mg via INTRAVENOUS
  Administered 2020-02-18: 20 mg via INTRAVENOUS

## 2020-02-18 MED ORDER — OXYCODONE HCL 5 MG PO TABS: 5.0000 mg | ORAL_TABLET | Freq: Four times a day (QID) | ORAL | 0 refills | Status: DC | PRN

## 2020-02-18 MED ORDER — ONDANSETRON HCL 4 MG/2ML IJ SOLN
INTRAMUSCULAR | Status: DC | PRN
  Administered 2020-02-18: 4 mg via INTRAVENOUS

## 2020-02-18 MED ORDER — MIDAZOLAM HCL 2 MG/2ML IJ SOLN
INTRAMUSCULAR | Status: DC | PRN
  Administered 2020-02-18: 2 mg via INTRAVENOUS

## 2020-02-18 MED ORDER — LACTATED RINGERS IV SOLN: INTRAVENOUS | Status: DC

## 2020-02-18 MED ORDER — SUGAMMADEX SODIUM 200 MG/2ML IV SOLN
INTRAVENOUS | Status: DC | PRN
  Administered 2020-02-18: 200 mg via INTRAVENOUS

## 2020-02-18 MED ORDER — EPHEDRINE SULFATE-NACL 50-0.9 MG/10ML-% IV SOSY
PREFILLED_SYRINGE | INTRAVENOUS | Status: DC | PRN
  Administered 2020-02-18: 10 mg via INTRAVENOUS

## 2020-02-18 MED ORDER — DEXAMETHASONE SODIUM PHOSPHATE 10 MG/ML IJ SOLN
INTRAMUSCULAR | Status: AC
  Filled 2020-02-18: qty 1

## 2020-02-18 MED ORDER — BUPIVACAINE HCL (PF) 0.25 % IJ SOLN
INTRAMUSCULAR | Status: DC | PRN
Start: 2020-02-18 — End: 2020-02-18
  Administered 2020-02-18: 10 mL

## 2020-02-18 MED ORDER — LIDOCAINE 2% (20 MG/ML) 5 ML SYRINGE
INTRAMUSCULAR | Status: AC
  Filled 2020-02-18: qty 5

## 2020-02-18 MED ORDER — DOCUSATE SODIUM 100 MG PO CAPS: 100.0000 mg | ORAL_CAPSULE | Freq: Two times a day (BID) | ORAL | 1 refills | Status: AC

## 2020-02-18 MED ORDER — HYDROMORPHONE HCL 1 MG/ML IJ SOLN
0.2500 mg | INTRAMUSCULAR | Status: DC | PRN
  Administered 2020-02-18 (×2): 0.25 mg via INTRAVENOUS
  Administered 2020-02-18: 0.5 mg via INTRAVENOUS

## 2020-02-18 MED ORDER — DEXAMETHASONE SODIUM PHOSPHATE 10 MG/ML IJ SOLN
INTRAMUSCULAR | Status: DC | PRN
  Administered 2020-02-18: 5 mg via INTRAVENOUS

## 2020-02-18 MED ORDER — KETOROLAC TROMETHAMINE 30 MG/ML IJ SOLN
INTRAMUSCULAR | Status: DC | PRN
  Administered 2020-02-18: 30 mg via INTRAVENOUS

## 2020-02-18 MED ORDER — ROCURONIUM BROMIDE 10 MG/ML (PF) SYRINGE
PREFILLED_SYRINGE | INTRAVENOUS | Status: AC
  Filled 2020-02-18: qty 10

## 2020-02-18 MED ORDER — ORAL CARE MOUTH RINSE: 15.0000 mL | Freq: Once | OROMUCOSAL | Status: AC

## 2020-02-18 MED ORDER — IBUPROFEN 800 MG PO TABS: 800.0000 mg | ORAL_TABLET | Freq: Three times a day (TID) | ORAL | 1 refills | Status: DC | PRN

## 2020-02-18 MED ORDER — ONDANSETRON HCL 4 MG/2ML IJ SOLN
INTRAMUSCULAR | Status: AC
  Filled 2020-02-18: qty 2

## 2020-02-18 MED ORDER — LIDOCAINE 2% (20 MG/ML) 5 ML SYRINGE
INTRAMUSCULAR | Status: DC | PRN
  Administered 2020-02-18: 60 mg via INTRAVENOUS

## 2020-02-18 MED ORDER — HYDROMORPHONE HCL 1 MG/ML IJ SOLN
INTRAMUSCULAR | Status: AC
  Administered 2020-02-18: 18:00:00 0.5 mg via INTRAVENOUS
  Filled 2020-02-18: qty 1

## 2020-02-18 MED ORDER — CHLORHEXIDINE GLUCONATE 0.12 % MT SOLN: 15.0000 mL | Freq: Once | OROMUCOSAL | Status: AC

## 2020-02-18 MED ORDER — CHLORHEXIDINE GLUCONATE 0.12 % MT SOLN
OROMUCOSAL | Status: AC
  Administered 2020-02-18: 14:00:00 15 mL via OROMUCOSAL
  Filled 2020-02-18: qty 15

## 2020-02-18 MED ORDER — PROPOFOL 10 MG/ML IV BOLUS
INTRAVENOUS | Status: DC | PRN
  Administered 2020-02-18: 150 mg via INTRAVENOUS

## 2020-02-18 MED ORDER — SODIUM CHLORIDE 0.9 % IR SOLN
Status: DC | PRN
  Administered 2020-02-18: 1000 mL

## 2020-02-18 MED ORDER — PROPOFOL 10 MG/ML IV BOLUS
INTRAVENOUS | Status: AC
  Filled 2020-02-18: qty 20

## 2020-02-18 MED ORDER — MIDAZOLAM HCL 2 MG/2ML IJ SOLN
INTRAMUSCULAR | Status: AC
  Filled 2020-02-18: qty 2

## 2020-02-18 MED ORDER — DEXMEDETOMIDINE (PRECEDEX) IN NS 20 MCG/5ML (4 MCG/ML) IV SYRINGE
PREFILLED_SYRINGE | INTRAVENOUS | Status: DC | PRN
  Administered 2020-02-18 (×5): 4 ug via INTRAVENOUS

## 2020-02-18 MED ORDER — ONDANSETRON HCL 4 MG/2ML IJ SOLN: 4.0000 mg | Freq: Once | INTRAMUSCULAR | Status: DC | PRN

## 2020-02-18 SURGICAL SUPPLY — 50 items
APPLIER CLIP ROT 10 11.4 M/L (STAPLE)
BLADE CLIPPER SURG (BLADE) ×3 IMPLANT
CANISTER SUCT 3000ML PPV (MISCELLANEOUS) ×3 IMPLANT
CHLORAPREP W/TINT 26 (MISCELLANEOUS) ×3 IMPLANT
CLIP APPLIE ROT 10 11.4 M/L (STAPLE) IMPLANT
COVER SURGICAL LIGHT HANDLE (MISCELLANEOUS) ×3 IMPLANT
COVER WAND RF STERILE (DRAPES) ×3 IMPLANT
CUTTER FLEX LINEAR 45M (STAPLE) ×3 IMPLANT
DERMABOND ADHESIVE PROPEN (GAUZE/BANDAGES/DRESSINGS) ×2
DERMABOND ADVANCED (GAUZE/BANDAGES/DRESSINGS) ×2
DERMABOND ADVANCED .7 DNX12 (GAUZE/BANDAGES/DRESSINGS) ×1 IMPLANT
DERMABOND ADVANCED .7 DNX6 (GAUZE/BANDAGES/DRESSINGS) ×1 IMPLANT
ELECT CAUTERY BLADE 6.4 (BLADE) ×3 IMPLANT
ELECT REM PT RETURN 9FT ADLT (ELECTROSURGICAL) ×3
ELECTRODE REM PT RTRN 9FT ADLT (ELECTROSURGICAL) ×1 IMPLANT
ENDOLOOP SUT PDS II  0 18 (SUTURE)
ENDOLOOP SUT PDS II 0 18 (SUTURE) IMPLANT
GAUZE 4X4 16PLY RFD (DISPOSABLE) ×3 IMPLANT
GLOVE BIO SURGEON STRL SZ 6.5 (GLOVE) ×2 IMPLANT
GLOVE BIO SURGEON STRL SZ7 (GLOVE) ×3 IMPLANT
GLOVE BIO SURGEON STRL SZ7.5 (GLOVE) ×3 IMPLANT
GLOVE BIO SURGEONS STRL SZ 6.5 (GLOVE) ×1
GLOVE BIOGEL PI IND STRL 6 (GLOVE) ×1 IMPLANT
GLOVE BIOGEL PI INDICATOR 6 (GLOVE) ×2
GOWN STRL REUS W/ TWL LRG LVL3 (GOWN DISPOSABLE) ×3 IMPLANT
GOWN STRL REUS W/TWL LRG LVL3 (GOWN DISPOSABLE) ×6
KIT BASIN OR (CUSTOM PROCEDURE TRAY) ×3 IMPLANT
KIT TURNOVER KIT B (KITS) ×3 IMPLANT
NEEDLE INSUFFLATION 14GA 120MM (NEEDLE) ×3 IMPLANT
NS IRRIG 1000ML POUR BTL (IV SOLUTION) ×3 IMPLANT
PAD ARMBOARD 7.5X6 YLW CONV (MISCELLANEOUS) ×6 IMPLANT
PENCIL BUTTON HOLSTER BLD 10FT (ELECTRODE) ×3 IMPLANT
POUCH SPECIMEN RETRIEVAL 10MM (ENDOMECHANICALS) ×3 IMPLANT
RELOAD 45 VASCULAR/THIN (ENDOMECHANICALS) IMPLANT
RELOAD STAPLE TA45 3.5 REG BLU (ENDOMECHANICALS) IMPLANT
SCISSORS LAP 5X35 DISP (ENDOMECHANICALS) IMPLANT
SET IRRIG TUBING LAPAROSCOPIC (IRRIGATION / IRRIGATOR) IMPLANT
SET TUBE SMOKE EVAC HIGH FLOW (TUBING) ×3 IMPLANT
SHEARS HARMONIC ACE PLUS 36CM (ENDOMECHANICALS) ×3 IMPLANT
SLEEVE ENDOPATH XCEL 5M (ENDOMECHANICALS) ×3 IMPLANT
SPECIMEN JAR SMALL (MISCELLANEOUS) ×3 IMPLANT
SUT MNCRL AB 4-0 PS2 18 (SUTURE) ×3 IMPLANT
SUT VICRYL 0 UR6 27IN ABS (SUTURE) ×3 IMPLANT
TOWEL GREEN STERILE (TOWEL DISPOSABLE) ×3 IMPLANT
TOWEL GREEN STERILE FF (TOWEL DISPOSABLE) ×3 IMPLANT
TRAY LAPAROSCOPIC MC (CUSTOM PROCEDURE TRAY) ×3 IMPLANT
TROCAR 5M 150ML BLDLS (TROCAR) ×3 IMPLANT
TROCAR XCEL BLUNT TIP 100MML (ENDOMECHANICALS) ×3 IMPLANT
TROCAR XCEL NON-BLD 5MMX100MML (ENDOMECHANICALS) ×3 IMPLANT
WATER STERILE IRR 1000ML POUR (IV SOLUTION) ×3 IMPLANT

## 2020-02-18 NOTE — Transfer of Care (Signed)
Immediate Anesthesia Transfer of Care Note  Patient: Victor Gonzalez  Procedure(s) Performed: APPENDECTOMY LAPAROSCOPIC (N/A Abdomen)  Patient Location: PACU  Anesthesia Type:General  Level of Consciousness: awake, alert  and oriented  Airway & Oxygen Therapy: Patient Spontanous Breathing  Post-op Assessment: Report given to RN and Post -op Vital signs reviewed and stable  Post vital signs: Reviewed and stable  Last Vitals:  Vitals Value Taken Time  BP 130/79 02/18/20 1741  Temp    Pulse 83 02/18/20 1741  Resp 18 02/18/20 1741  SpO2 93 % 02/18/20 1741  Vitals shown include unvalidated device data.  Last Pain:  Vitals:   02/18/20 1418  TempSrc:   PainSc: 0-No pain      Patients Stated Pain Goal: 3 (02/18/20 1418)  Complications: No complications documented.

## 2020-02-18 NOTE — Anesthesia Preprocedure Evaluation (Signed)
Anesthesia Evaluation  Patient identified by MRN, date of birth, ID band Patient awake    Reviewed: Allergy & Precautions, NPO status , Patient's Chart, lab work & pertinent test results  Airway Mallampati: II  TM Distance: >3 FB Neck ROM: Full    Dental  (+) Teeth Intact   Pulmonary neg pulmonary ROS,    Pulmonary exam normal        Cardiovascular hypertension,  Rhythm:Regular Rate:Normal     Neuro/Psych negative neurological ROS  negative psych ROS   GI/Hepatic Neg liver ROS, appendicitis   Endo/Other  negative endocrine ROS  Renal/GU negative Renal ROS  negative genitourinary   Musculoskeletal negative musculoskeletal ROS (+)   Abdominal (+)  Abdomen: soft. Bowel sounds: normal.  Peds  Hematology negative hematology ROS (+)   Anesthesia Other Findings   Reproductive/Obstetrics                             Anesthesia Physical Anesthesia Plan  ASA: II  Anesthesia Plan: General   Post-op Pain Management:    Induction: Intravenous  PONV Risk Score and Plan: 2 and Ondansetron, Dexamethasone, Midazolam and Treatment may vary due to age or medical condition  Airway Management Planned: Mask and Oral ETT  Additional Equipment: None  Intra-op Plan:   Post-operative Plan: Extubation in OR  Informed Consent: I have reviewed the patients History and Physical, chart, labs and discussed the procedure including the risks, benefits and alternatives for the proposed anesthesia with the patient or authorized representative who has indicated his/her understanding and acceptance.     Dental advisory given  Plan Discussed with: CRNA  Anesthesia Plan Comments: (Lab Results      Component                Value               Date                      WBC                      5.6                 02/18/2020                HGB                      14.5                02/18/2020                 HCT                      40.5                02/18/2020                MCV                      90.8                02/18/2020                PLT                      224  02/18/2020           Lab Results      Component                Value               Date                      NA                       140                 02/18/2020                K                        3.9                 02/18/2020                CO2                      23                  02/18/2020                GLUCOSE                  132 (H)             02/18/2020                BUN                      17                  02/18/2020                CREATININE               0.84                02/18/2020                CALCIUM                  8.4 (L)             02/18/2020                GFRNONAA                 >60                 02/18/2020                GFRAA                    141                 12/27/2019          )        Anesthesia Quick Evaluation

## 2020-02-18 NOTE — Progress Notes (Signed)
Patient still having trouble voiding. Bladder scan resulted 270. Restarted iv fluids on pt and reassured him. MD notified about it. Suggested if patient is able to void, he can be d/ced after that or can stay till tomorrow and get d/ced.

## 2020-02-18 NOTE — Progress Notes (Signed)
Interpreter Hoover Brunette [phone] used to communicate via the Tele-interpreter service.

## 2020-02-18 NOTE — Op Note (Signed)
° °  Operative Note   Date: 02/18/2020  Procedure: laparoscopic appendectomy  Pre-op diagnosis: acute appendicitis Post-op diagnosis: grade 1b appendicitis  Indication and clinical history: The patient is a 34 y.o. year old male with acute appendicitis.     Surgeon: Diamantina Monks, MD Assistant: Carolynne Edouard, MD  Anesthesiologist: Nance Pew, DO Anesthesia: General  Findings:   Specimen: appendix  EBL: <10cc  Drains/Implants: none  Disposition: PACU - hemodynamically stable.  Description of procedure: The patient was positioned supine on the operating room table. Time-out was performed verifying correct patient, procedure, signature of informed consent, and administration of pre-operative antibiotics. General anesthetic induction and intubation were uneventful. Foley catheter insertion was not performed as patient voided immediately prior to the procedure . The abdomen was prepped and draped in the usual sterile fashion. An infra-umbilical incision was made using an open technique using zero vicryl stay sutures on either side of the fascia and a 27mm Hassan port inserted. There was difficulty in establishing pneumoperitoneum and a Veress needle was used in the left upper quadrant to establish pneumoperitoneum. At this point, the [facility] port was re-inserted at the umbilicus and entry to the abdominal cavity was gained. The abdomen was carefully inspected and no intra-abdominal injury was identified. A 74mm port was placed under direct visualization in the left upper quadrant and the left lower quadrant, both using local anesthetic. The patient was repositioned to Trendelenburg with the left side down. Further inspection of the right lower quadrant revealed no abscess or purulent fluid and grade 1b appendicitis. The appendix was dissected away from its mesoappendix and a blue staple load used to staple across the appendix at its base. The Harmonic was used to divide the mesoappendix using a vascular  load. The staple line was inspected and found to be intact and without bleeding. The appendix was placed in an endoscopic specimen retrieval bag, removed via the umbilical port site, and sent to pathology as a specimen. The right lower quadrant was again inspected and hemostasis confirmed. All ports were removed after desufflating the abdomen and the fascia was re-approximated using the two figure of eight vicryl sutures. Additional local anesthetic was administered at the umbilical incision site. The skin of all port sites was closed with 4-0 monocryl. Sterile dressings were applied. All sponge and instrument counts were correct at the conclusion of the procedure. The patient was awakened from anesthesia, extubated uneventfully, and transported to the PACU in good condition. There were no complications.    Diamantina Monks, MD General and Trauma Surgery [facility]

## 2020-02-18 NOTE — Progress Notes (Signed)
General Surgery Follow Up Note  Subjective:    Overnight Issues:   Objective:  Vital signs for last 24 hours: Temp:  [98.2 F (36.8 C)-98.8 F (37.1 C)] 98.6 F (37 C) (12/24 1221) Pulse Rate:  [52-80] 52 (12/24 1221) Resp:  [15-18] 16 (12/24 1221) BP: (111-134)/(66-80) 111/78 (12/24 1221) SpO2:  [94 %-98 %] 97 % (12/24 1221)  Hemodynamic parameters for last 24 hours:    Intake/Output from previous day: 12/23 0701 - 12/24 0700 In: 2099 [I.V.:900; IV Piggyback:1199] Out: -   Intake/Output this shift: No intake/output data recorded.  Vent settings for last 24 hours:    Physical Exam:  Gen: comfortable, no distress Neuro: non-focal exam HEENT: PERRL Neck: supple CV: RRR Pulm: unlabored breathing Abd: soft, RLQ TTP, no rebound GU: clear yellow urine Extr: wwp, no edema   Results for orders placed or performed during the hospital encounter of 02/17/20 (from the past 24 hour(s))  Resp Panel by RT-PCR (Flu A&B, Covid) Nasopharyngeal Swab     Status: None   Collection Time: 02/17/20  3:24 PM   Specimen: Nasopharyngeal Swab; Nasopharyngeal(NP) swabs in vial transport medium  Result Value Ref Range   SARS Coronavirus 2 by RT PCR NEGATIVE NEGATIVE   Influenza A by PCR NEGATIVE NEGATIVE   Influenza B by PCR NEGATIVE NEGATIVE  Surgical pcr screen     Status: None   Collection Time: 02/18/20  1:03 AM   Specimen: Nasal Mucosa; Nasal Swab  Result Value Ref Range   MRSA, PCR NEGATIVE NEGATIVE   Staphylococcus aureus NEGATIVE NEGATIVE  Basic metabolic panel     Status: Abnormal   Collection Time: 02/18/20  3:49 AM  Result Value Ref Range   Sodium 140 135 - 145 mmol/L   Potassium 3.9 3.5 - 5.1 mmol/L   Chloride 108 98 - 111 mmol/L   CO2 23 22 - 32 mmol/L   Glucose, Bld 132 (H) 70 - 99 mg/dL   BUN 17 6 - 20 mg/dL   Creatinine, Ser 4.13 0.61 - 1.24 mg/dL   Calcium 8.4 (L) 8.9 - 10.3 mg/dL   GFR, Estimated >24 >40 mL/min   Anion gap 9 5 - 15  CBC     Status: None    Collection Time: 02/18/20  3:49 AM  Result Value Ref Range   WBC 5.6 4.0 - 10.5 K/uL   RBC 4.46 4.22 - 5.81 MIL/uL   Hemoglobin 14.5 13.0 - 17.0 g/dL   HCT 10.2 72.5 - 36.6 %   MCV 90.8 80.0 - 100.0 fL   MCH 32.5 26.0 - 34.0 pg   MCHC 35.8 30.0 - 36.0 g/dL   RDW 44.0 34.7 - 42.5 %   Platelets 224 150 - 400 K/uL   nRBC 0.0 0.0 - 0.2 %    Assessment & Plan:  Present on Admission: . Acute appendicitis    LOS: 0 days   Additional comments:I reviewed the patient's new clinical lab test results.   and I reviewed the patients new imaging test results.    Acute appendicitis - lap appy today. Imaging reviewed by me. Informed consent was obtained after detailed explanation of risks, including bleeding, infection, abscess, staple line leak, stump appendicitis, and need for conversion to open procedure. All questions answered to the patient's satisfaction. Informed consent performed using the assistance of a Spanish interpreter via the iPad. Interpreter: Gerre Couch FEN - reg diet postop DVT - SCDs Dispo - home post-op   Brother to be notified post-op,  contact info in the chart.  Diamantina Monks, MD Trauma & General Surgery Please use [email or web address] to contact on call provider  02/18/2020  *Care during the described time interval was provided by me. I have reviewed this patient's available data, including medical history, events of note, physical examination and test results as part of my evaluation.

## 2020-02-18 NOTE — Progress Notes (Signed)
Pt arrived, from the PACU at 1858 and pt had discharge orders that were placed downstairs.. RN was trying to go over paperwork with the patient and he stated he had to pee. Helped the patient stand and he was not able to pee stood for 10 minutes with him trying with no luck. Paged MD told her the situation and wether she still wanted me to DC patient she stated to Bladder scan him and follow up.Marland KitchenMarland Kitchen

## 2020-02-18 NOTE — Anesthesia Procedure Notes (Signed)
Procedure Name: Intubation Date/Time: 02/18/2020 3:32 PM Performed by: Dorthea Cove, CRNA Pre-anesthesia Checklist: Patient identified, Emergency Drugs available, Suction available and Patient being monitored Patient Re-evaluated:Patient Re-evaluated prior to induction Oxygen Delivery Method: Circle system utilized Preoxygenation: Pre-oxygenation with 100% oxygen Induction Type: IV induction Ventilation: Mask ventilation without difficulty Laryngoscope Size: Mac and 4 Grade View: Grade I Tube type: Oral Tube size: 7.5 mm Number of attempts: 1 Airway Equipment and Method: Stylet and Oral airway Placement Confirmation: ETT inserted through vocal cords under direct vision,  positive ETCO2 and breath sounds checked- equal and bilateral Secured at: 23 cm Tube secured with: Tape Dental Injury: Teeth and Oropharynx as per pre-operative assessment

## 2020-02-19 NOTE — Progress Notes (Signed)
Approximate 3x3 inc soft swollen area noted distal to umbilical incision, pt admits to tenderness at site w/palpation. Pt endorses generalized surgical discomfort. Incisional sites luq umbilical and llq cdi. Spanish language line utilized to review discharge instructions with pt/family member. CMS physician asst to bedside to assess swollen area below umbilicus. PA attributes to bruising and reassures pt.

## 2020-02-20 ENCOUNTER — Encounter (HOSPITAL_COMMUNITY): Payer: Self-pay | Admitting: Surgery

## 2020-02-20 NOTE — Discharge Summary (Signed)
    Patient ID: Javaun Dimperio [id number] 1985-05-11 34 y.o.  Admit date: 02/17/2020 Discharge date: 02/20/2020  Admitting Diagnosis: acute appendictis  Discharge Diagnosis Patient Active Problem List   Diagnosis Date Noted  . Acute appendicitis 02/17/2020  . Prediabetes 01/10/2020  . Elevated liver enzymes 12/27/2019  . Shortness of breath 12/27/2019  . Cough 12/06/2019  . Rash 12/06/2019  . Pneumonia due to COVID-19 virus 11/19/2019  . COVID-19 virus infection 11/19/2019    Consultants none  Reason for Admission: Acute appendictis  Procedures Laparoscopic appendectomy  Hospital Course:  uncomplicated    Physical Exam: Gen: comfortable, no distress Neuro: non-focal exam HEENT: PERRL Neck: supple CV: RRR Pulm: unlabored breathing Abd: soft, appropriately tender, incision c/d/i  GU: clear yellow urine Extr: wwp, no edema   Allergies as of 02/19/2020   No Known Allergies     Medication List    TAKE these medications   acetaminophen 500 MG tablet Commonly known as: TYLENOL Take 500-1,000 mg by mouth as needed (pain).   albuterol 108 (90 Base) MCG/ACT inhaler Commonly known as: VENTOLIN HFA Inhale 2 puffs into the lungs every 6 (six) hours as needed for wheezing or shortness of breath.   docusate sodium 100 MG capsule Commonly known as: Colace Take 1 capsule (100 mg total) by mouth 2 (two) times daily.   ibuprofen 800 MG tablet Commonly known as: ADVIL Take 1 tablet (800 mg total) by mouth every 8 (eight) hours as needed.   oxyCODONE 5 MG immediate release tablet Commonly known as: Roxicodone Take 1 tablet (5 mg total) by mouth every 6 (six) hours as needed.         Follow-up Information    Schedule an appointment as soon as possible for a visit  with Barbette Merino, NP.   Specialty: Adult Health Nurse Practitioner Contact information: [address] [place] [address] [place] [address] [phone]        [facility] EMERGENCY DEPARTMENT.   Specialty: Emergency Medicine Why: If symptoms worsen Contact information: [address] [id number] mc [place] [address] [phone]       Surgery, [place]. Call in 1 day(s).   Specialty: General Surgery Why: To confirm your appointment time. Please bring a copy of your photo ID and insurance card. Please arrive 30 minutes prior to your appointment  Contact information: [address] [place] [address] [phone]                Signed: Diamantina Monks, MD [facility] 02/20/2020, 2:47 PM

## 2020-02-21 NOTE — Anesthesia Postprocedure Evaluation (Signed)
Anesthesia Post Note  Patient: Victor Gonzalez  Procedure(s) Performed: APPENDECTOMY LAPAROSCOPIC (N/A Abdomen)     Patient location during evaluation: PACU Anesthesia Type: General Level of consciousness: awake and alert Pain management: pain level controlled Vital Signs Assessment: post-procedure vital signs reviewed and stable Respiratory status: spontaneous breathing, nonlabored ventilation, respiratory function stable and patient connected to nasal cannula oxygen Cardiovascular status: blood pressure returned to baseline and stable Postop Assessment: no apparent nausea or vomiting Anesthetic complications: no   No complications documented.  Last Vitals:  Vitals:   02/18/20 2257 02/19/20 0543  BP: 120/76 106/69  Pulse: 67 65  Resp: 16 16  Temp: 37.1 C 37 C  SpO2: 98% 98%    Last Pain:  Vitals:   02/19/20 1050  TempSrc:   PainSc: 5                  [name] P Hae Ahlers

## 2020-02-29 ENCOUNTER — Ambulatory Visit (INDEPENDENT_AMBULATORY_CARE_PROVIDER_SITE_OTHER): Payer: HRSA Program | Admitting: Nurse Practitioner

## 2020-02-29 DIAGNOSIS — J1282 Pneumonia due to coronavirus disease 2019: Secondary | ICD-10-CM | POA: Diagnosis not present

## 2020-02-29 DIAGNOSIS — U071 COVID-19: Secondary | ICD-10-CM | POA: Diagnosis not present

## 2020-02-29 NOTE — Progress Notes (Signed)
@Patient  ID: Victor Gonzalez, male    DOB: 01/13/1986, 35 y.o.   MRN: [id number]  Chief Complaint  Patient presents with  . Follow-up    No questions or concerns, no longer having sx    Referring provider: No ref. provider found   35 year old male with history of hypertension.  HPI   Patient presents today for post COVID care clinic visit follow-up.  Patient was last seen here on 12/27/2019.  Since that time he has been admitted to the hospital again for acute appendicitis with appendectomy.  This was on 02/17/2020.  Patient does need appointment scheduled for hospital follow-up with PCP.  We will schedule this during her visit today.  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  Since that time CT abdomen and follow-up chest x-ray in the hospital were clear for pneumonia or fibrosis.  Patient states that he has improved.  He is no longer having symptoms associated with Covid.  We will cancel his upcoming appointment for evaluation by pulmonary since imaging was clear and patient is feeling better. Denies f/c/s, n/v/d, hemoptysis, PND, chest pain or edema.     No Known Allergies  Immunization History  Administered Date(s) Administered  . Influenza,inj,Quad PF,6+ Mos 01/06/2020    Past Medical History:  Diagnosis Date  . Hypertension   . Thyroid disease     Tobacco History: Social History   Tobacco Use  Smoking Status Never Smoker  Smokeless Tobacco Never Used   Counseling given: Not Answered   Outpatient Encounter Medications as of 02/29/2020  Medication Sig  . acetaminophen (TYLENOL) 500 MG tablet Take 500-1,000 mg by mouth as needed (pain).  04/28/2020 docusate sodium (COLACE) 100 MG capsule Take 1 capsule (100 mg total) by mouth 2 (two) times daily.  Marland Kitchen ibuprofen (ADVIL) 800 MG tablet Take 1 tablet (800 mg total) by mouth every 8 (eight) hours as needed.  Marland Kitchen oxyCODONE (ROXICODONE) 5 MG immediate release tablet Take 1  tablet (5 mg total) by mouth every 6 (six) hours as needed.  Marland Kitchen albuterol (VENTOLIN HFA) 108 (90 Base) MCG/ACT inhaler Inhale 2 puffs into the lungs every 6 (six) hours as needed for wheezing or shortness of breath. (Patient not taking: No sig reported)   No facility-administered encounter medications on file as of 02/29/2020.     Review of Systems  Review of Systems  Constitutional: Negative.   HENT: Negative.   Respiratory: Negative for cough and shortness of breath.   Cardiovascular: Negative.  Negative for chest pain, palpitations and leg swelling.  Gastrointestinal: Negative.   Allergic/Immunologic: Negative.   Neurological: Negative.   Psychiatric/Behavioral: Negative.        Physical Exam  BP 138/86 (BP Location: Left Arm)   Pulse 70   Temp (!) 97.1 F (36.2 C)   Ht 5\' 5"  (1.651 m)   Wt 204 lb (92.5 kg)   SpO2 98%   BMI 33.95 kg/m   Wt Readings from Last 5 Encounters:  02/29/20 204 lb (92.5 kg)  01/06/20 211 lb 12.8 oz (96.1 kg)  12/27/19 211 lb (95.7 kg)  12/06/19 208 lb (94.3 kg)  11/20/19 206 lb 9.1 oz (93.7 kg)     Physical Exam Vitals and nursing note reviewed.  Constitutional:      General: He is not in acute distress.    Appearance: He is well-developed and well-nourished.  Cardiovascular:     Rate and Rhythm: Normal rate and regular rhythm.  Pulmonary:  Effort: Pulmonary effort is normal.     Breath sounds: Normal breath sounds.  Musculoskeletal:     Right lower leg: No edema.     Left lower leg: No edema.  Skin:    General: Skin is warm and dry.  Neurological:     Mental Status: He is alert and oriented to person, place, and time.  Psychiatric:        Mood and Affect: Mood and affect and mood normal.        Behavior: Behavior normal.       Imaging: DG Chest 1 View  Result Date: 02/17/2020 CLINICAL DATA:  Recent pneumonia, nausea and vomiting since yesterday, acute appendicitis EXAM: CHEST  1 VIEW COMPARISON:  12/06/2019 FINDINGS:  Single frontal view of the chest demonstrates an unremarkable cardiac silhouette. There has been interval resolution of the bilateral airspace disease seen previously. No acute airspace disease, effusion, or pneumothorax. No acute bony abnormalities. Postsurgical changes left clavicle. IMPRESSION: 1. No acute intrathoracic process. Electronically Signed   By: Sharlet Salina M.D.   On: 02/17/2020 17:36   CT ABDOMEN PELVIS W CONTRAST  Result Date: 02/17/2020 CLINICAL DATA:  Right lower quadrant pain, nausea and vomiting since yesterday EXAM: CT ABDOMEN AND PELVIS WITH CONTRAST TECHNIQUE: Multidetector CT imaging of the abdomen and pelvis was performed using the standard protocol following bolus administration of intravenous contrast. CONTRAST:  OMNIPAQUE IOHEXOL 300 MG/ML  SOLN COMPARISON:  05/24/2010 FINDINGS: Lower chest: No acute pleural or parenchymal lung disease. Hepatobiliary: Mild diffuse hepatic steatosis. No focal liver abnormalities. The gallbladder is unremarkable. Pancreas: Unremarkable. No pancreatic ductal dilatation or surrounding inflammatory changes. Spleen: Normal in size without focal abnormality. Adrenals/Urinary Tract: 3.6 cm simple cyst lateral margin left kidney. Otherwise the kidneys enhance normally and symmetrically. No urinary tract calculi or obstruction. Bladder and adrenals are unremarkable. Stomach/Bowel: There is a dilated retrocecal appendix measuring 14 mm in diameter, with mild periappendiceal fat stranding. Findings are consistent with acute uncomplicated appendicitis. No perforation, fluid collection, or abscess. No evidence of appendicoliths. No bowel obstruction or ileus. Vascular/Lymphatic: No significant vascular findings are present. No enlarged abdominal or pelvic lymph nodes. Multiple small lymph nodes in the right lower quadrant mesentery are likely reactive given the adjacent appendicitis. Reproductive: Prostate is unremarkable. Other: No free fluid or free  gas.  No abdominal wall hernia. Musculoskeletal: No acute or destructive bony lesions. Reconstructed images demonstrate no additional findings. IMPRESSION: 1. Acute uncomplicated appendicitis. No perforation, fluid collection, or abscess. 2. Mild hepatic steatosis. Electronically Signed   By: Sharlet Salina M.D.   On: 02/17/2020 15:20     Assessment & Plan:   Pneumonia due to COVID-19 virus COVID-19 virus infection Respiratory failure with hypoxia:  Much improved - imaging now clear   Stay well hydrated  Stay active  Deep breathing exercises  May take tylenol for fever or pain  May take zyrtec daily   Upcoming appointment for pulmonary cancelled  appointment scheduled with PCP tomorrow for f/up for appendectomy     Follow up:   if needed    Ivonne Andrew, NP 02/29/2020

## 2020-02-29 NOTE — Assessment & Plan Note (Signed)
COVID-19 virus infection Respiratory failure with hypoxia:  Much improved - imaging now clear   Stay well hydrated  Stay active  Deep breathing exercises  May take tylenol for fever or pain  May take zyrtec daily   Upcoming appointment for pulmonary cancelled  appointment scheduled with PCP tomorrow for f/up for appendectomy     Follow up:

## 2020-02-29 NOTE — Patient Instructions (Signed)
Pneumonia due to COVID-19 virus COVID-19 virus infection Respiratory failure with hypoxia:  Much improved - imaging now clear   Stay well hydrated  Stay active  Deep breathing exercises  May take tylenol for fever or pain  May take zyrtec daily   Upcoming appointment for pulmonary cancelled  appointment scheduled with PCP tomorrow for f/up for appendectomy     Follow up:  Follow up  if needed

## 2020-03-01 ENCOUNTER — Ambulatory Visit (INDEPENDENT_AMBULATORY_CARE_PROVIDER_SITE_OTHER): Payer: Self-pay | Admitting: Nurse Practitioner

## 2020-03-01 ENCOUNTER — Ambulatory Visit (HOSPITAL_COMMUNITY)
Admission: RE | Admit: 2020-03-01 | Discharge: 2020-03-01 | Disposition: A | Discharge status: Home / Self Care | Payer: No Typology Code available for payment source | Source: Ambulatory Visit | Attending: Nurse Practitioner | Admitting: Nurse Practitioner

## 2020-03-01 ENCOUNTER — Encounter: Payer: Self-pay | Admitting: Nurse Practitioner

## 2020-03-01 ENCOUNTER — Other Ambulatory Visit: Payer: Self-pay

## 2020-03-01 VITALS — BP 118/72 | HR 80 | Temp 98.8°F | Resp 18 | Ht 65.0 in | Wt 205.4 lb

## 2020-03-01 DIAGNOSIS — Z8616 Personal history of COVID-19: Secondary | ICD-10-CM

## 2020-03-01 DIAGNOSIS — R1032 Left lower quadrant pain: Secondary | ICD-10-CM

## 2020-03-01 DIAGNOSIS — R7303 Prediabetes: Secondary | ICD-10-CM

## 2020-03-01 NOTE — Progress Notes (Signed)
[facility] Patient [facility] [address] [place], [place]  [address] Phone:  [phone]   Fax:  [phone]   Established Patient Office Visit  Subjective:  Patient ID: Victor Gonzalez, male    DOB: 1986/02/17  Age: 35 y.o. MRN: [id number]  CC:  Chief Complaint  Patient presents with   Hospitalization Follow-up    HPI Waleed Ortega-Tolentino presents for hospital follow up. He  has a past medical history of Hypertension and Thyroid disease.   Abdominal Pain Patient complains of abdominal pain. He is SP laparoscopic  appendectomy. He is increased pain with movement especially from the commode. The pain is located in the LLQ. The pain is described as aching, pressure-like, sharp and shooting, and is 10/10 in intensity. Onset was several days ago. Symptoms have been gradually worsening since. Aggravating factors include activity.  Alleviating factors include inactivity. Associated symptoms include chills, dysuria and decreased appetite. The patient denies anorexia, arthralgias, belching, constipation, dysuria, fever, frequency, headache, hematochezia, hematuria, melena, nausea, sweats and vomiting. He admits that he tried to contact the surgeon for an earlier apt however this was denied; no availability. He is concern that there maybe infection.   Past Medical History:  Diagnosis Date   Hypertension    Thyroid disease     Past Surgical History:  Procedure Laterality Date   LAPAROSCOPIC APPENDECTOMY N/A 02/18/2020   Procedure: APPENDECTOMY LAPAROSCOPIC;  Surgeon: Diamantina Monks, MD;  Location: [facility] OR;  Service: General;  Laterality: N/A;   SHOULDER SURGERY      No family history on file.  Social History   Socioeconomic History   Marital status: Single    Spouse name: Not on file   Number of children: Not on file   Years of education: Not on file   Highest education level: Not on file  Occupational History   Not on file  Tobacco Use    Smoking status: Never Smoker   Smokeless tobacco: Never Used  Vaping Use   Vaping Use: Never used  Substance and Sexual Activity   Alcohol use: No   Drug use: No   Sexual activity: Never  Other Topics Concern   Not on file  Social History Narrative   ** Merged History Encounter **       Social Determinants of Health   Financial Resource Strain: Not on file  Food Insecurity: Not on file  Transportation Needs: Not on file  Physical Activity: Not on file  Stress: Not on file  Social Connections: Not on file  Intimate Partner Violence: Not on file    Outpatient Medications Prior to Visit  Medication Sig Dispense Refill   acetaminophen (TYLENOL) 500 MG tablet Take 500-1,000 mg by mouth as needed (pain).     albuterol (VENTOLIN HFA) 108 (90 Base) MCG/ACT inhaler Inhale 2 puffs into the lungs every 6 (six) hours as needed for wheezing or shortness of breath. (Patient not taking: No sig reported) 6.7 g 0   docusate sodium (COLACE) 100 MG capsule Take 1 capsule (100 mg total) by mouth 2 (two) times daily. 60 capsule 1   ibuprofen (ADVIL) 800 MG tablet Take 1 tablet (800 mg total) by mouth every 8 (eight) hours as needed. 90 tablet 1   oxyCODONE (ROXICODONE) 5 MG immediate release tablet Take 1 tablet (5 mg total) by mouth every 6 (six) hours as needed. 30 tablet 0   No facility-administered medications prior to visit.    No Known Allergies  ROS Review of  Systems  Constitutional: Positive for appetite change (once daily in am) and chills (last night). Negative for fever.  Gastrointestinal: Positive for abdominal pain (LLQ) and diarrhea (soft). Negative for nausea.       Bloating varies see before eating Small bowel movements      Objective:    Physical Exam Constitutional:      General: He is in acute distress.     Appearance: He is obese. He is not ill-appearing or toxic-appearing.  HENT:     Head: Normocephalic and atraumatic.  Cardiovascular:     Rate and  Rhythm: Normal rate and regular rhythm.     Pulses: Normal pulses.     Heart sounds: Normal heart sounds.  Abdominal:     General: Bowel sounds are decreased.     Tenderness: There is abdominal tenderness in the periumbilical area and suprapubic area. There is guarding.     Comments: 4 lap sites to left of abdomen Increased firmness to lower portion of umbilical incision ~half dollar size, increased tenderness no visible sign of infection from lap sites  Musculoskeletal:     Cervical back: Normal range of motion.  Neurological:     Mental Status: He is alert.     BP 118/72 (BP Location: Left Arm, Patient Position: Sitting, Cuff Size: Normal)    Pulse 80    Temp 98.8 F (37.1 C)    Resp 18    Wt 205 lb 6.4 oz (93.2 kg)    SpO2 99%    BMI 34.18 kg/m  Wt Readings from Last 3 Encounters:  03/01/20 205 lb 6.4 oz (93.2 kg)  02/29/20 204 lb (92.5 kg)  01/06/20 211 lb 12.8 oz (96.1 kg)     Health Maintenance Due  Topic Date Due   Hepatitis C Screening  Never done   COVID-19 Vaccine (1) Never done   TETANUS/TDAP  Never done    There are no preventive care reminders to display for this patient.  No results found for: TSH Lab Results  Component Value Date   WBC 5.6 02/18/2020   HGB 14.5 02/18/2020   HCT 40.5 02/18/2020   MCV 90.8 02/18/2020   PLT 224 02/18/2020   Lab Results  Component Value Date   NA 140 02/18/2020   K 3.9 02/18/2020   CO2 23 02/18/2020   GLUCOSE 132 (H) 02/18/2020   BUN 17 02/18/2020   CREATININE 0.84 02/18/2020   BILITOT 1.0 02/17/2020   ALKPHOS 106 02/17/2020   AST 32 02/17/2020   ALT 42 02/17/2020   PROT 6.6 02/17/2020   ALBUMIN 4.3 02/17/2020   CALCIUM 8.4 (L) 02/18/2020   ANIONGAP 9 02/18/2020   No results found for: CHOL No results found for: HDL No results found for: LDLCALC No results found for: TRIG No results found for: CHOLHDL Lab Results  Component Value Date   HGBA1C 5.6 01/06/2020   HGBA1C 5.6 01/06/2020   HGBA1C 5.6 (A)  01/06/2020   HGBA1C 5.6 01/06/2020      Assessment & Plan:   Problem List Items Addressed This Visit      Other   Prediabetes    Other Visit Diagnoses    Left lower quadrant abdominal pain    -  Primary Further evaluation with imaging ordered  Educational material provided   Encouraged patient to continue to reach out to surgery team for a sooner apt for additional guidance    Relevant Orders   US Abdomen Complete (Completed)   DG Abd 2  Views (Completed)   Personal history of COVID-19          No orders of the defined types were placed in this encounter.   Follow-up: No follow-ups on file.    Vevelyn Francois, NP

## 2020-03-01 NOTE — Patient Instructions (Signed)
Laparoscopic Appendectomy, Adult, Care After This sheet gives you information about how to care for yourself after your procedure. Your doctor may also give you more specific instructions. If you have problems or questions, contact your doctor. What can I expect after the procedure? After the procedure, it is common to have:  Little energy for normal activities.  Mild pain in the area where the cuts from surgery (incisions) were made.  Trouble pooping (constipation). This can be caused by: ? Pain medicine. ? A lack of activity. Follow these instructions at home: Medicines  Take over-the-counter and prescription medicines only as told by your doctor.  If you were prescribed an antibiotic medicine, take it as told by your doctor. Do not stop taking it even if you start to feel better.  Do not drive or use heavy machinery while taking prescription pain medicine.  Ask your doctor if the medicine you are taking can cause trouble pooping. You may need to take steps to prevent or treat trouble pooping: ? Drink enough fluid to keep your pee (urine) pale yellow. ? Take over-the-counter or prescription medicines. ? Eat foods that are high in fiber. These include beans, whole grains, and fresh fruits and vegetables. ? Limit foods that are high in fat and sugar. These include fried or sweet foods. Incision care   Follow instructions from your doctor about how to take care of your cuts from surgery. Make sure you: ? Wash your hands with soap and water before and after you change your bandage (dressing). If you cannot use soap and water, use hand sanitizer. ? Change your bandage as told by your doctor. ? Leave stitches (sutures), skin glue, or skin tape (adhesive) strips in place. They may need to stay in place for 2 weeks or longer. If tape strips get loose and curl up, you may trim the loose edges. Do not remove tape strips completely unless your doctor says it is okay.  Check your cuts from  surgery every day for signs of infection. Check for: ? Redness, swelling, or pain. ? Fluid or blood. ? Warmth. ? Pus or a bad smell. Bathing  Keep your cuts from surgery clean and dry. Clean them as told by your doctor. To do this: 1. Gently wash the cuts with soap and water. 2. Rinse the cuts with water to remove all soap. 3. Pat the cuts dry with a clean towel. Do not rub the cuts.  Do not take baths, swim, or use a hot tub for 2 weeks, or until your doctor says it is okay. You may take showers after 48 hours. Activity   Do not drive for 24 hours if you were given a medicine to help you relax (sedative) during your procedure.  Rest after the procedure. Return to your normal activities as told by your doctor. Ask your doctor what activities are safe for you.  For 3 weeks, or for as long as told by your doctor: ? Do not lift anything that is heavier than 10 lb (4.5 kg), or the limit that you are told. ? Do not play contact sports. General instructions  If you were sent home with a drain, follow instructions from your doctor on how to care for it.  Take deep breaths. This helps to keep your lungs from getting an infection (pneumonia).  Keep all follow-up visits as told by your doctor. This is important. Contact a doctor if:  You have redness, swelling, or pain around a cut from surgery.    You have fluid or blood coming from a cut.  Your cut feels warm to the touch.  You have pus or a bad smell coming from a cut or a bandage.  The edges of a cut break open after the stitches have been taken out.  You have pain in your shoulders that gets worse.  You feel dizzy or you pass out (faint).  You have shortness of breath.  You keep feeling sick to your stomach (nauseous).  You keep throwing up (vomiting).  You get watery poop (diarrhea) or you cannot control your poop.  You lose your appetite.  You have swelling or pain in your legs.  You get a rash. Get help right  away if:  You have a fever.  You have trouble breathing.  You have sharp pains in your chest. Summary  After the procedure, it is common to have low energy, mild pain, and trouble pooping.  Infection is a common problem after this procedure. Follow your doctor's instructions about caring for yourself after the procedure.  Rest after the procedure. Return to your normal activities as told by your doctor.  Contact your doctor if you see signs of infection around your cuts from surgery, or you get short of breath. Get help right away if you have a fever, chest pain, or trouble breathing. This information is not intended to replace advice given to you by your health care provider. Make sure you discuss any questions you have with your health care provider. Document Revised: 08/14/2017 Document Reviewed: 08/14/2017 [employer]vier Patient Education  2020 [employer]  

## 2020-03-02 LAB — SURGICAL PATHOLOGY

## 2020-03-03 ENCOUNTER — Ambulatory Visit (HOSPITAL_COMMUNITY)
Admission: RE | Admit: 2020-03-03 | Discharge: 2020-03-03 | Disposition: A | Discharge status: Home / Self Care | Payer: Self-pay | Source: Ambulatory Visit | Attending: Nurse Practitioner | Admitting: Nurse Practitioner

## 2020-03-03 ENCOUNTER — Other Ambulatory Visit: Payer: Self-pay

## 2020-03-03 DIAGNOSIS — R1032 Left lower quadrant pain: Secondary | ICD-10-CM | POA: Insufficient documentation

## 2020-03-04 ENCOUNTER — Encounter: Payer: Self-pay | Admitting: Nurse Practitioner

## 2020-03-15 ENCOUNTER — Institutional Professional Consult (permissible substitution): Payer: No Typology Code available for payment source | Admitting: Pulmonary Disease

## 2020-07-05 ENCOUNTER — Ambulatory Visit (INDEPENDENT_AMBULATORY_CARE_PROVIDER_SITE_OTHER): Payer: No Typology Code available for payment source | Admitting: Nurse Practitioner

## 2020-07-05 ENCOUNTER — Encounter: Payer: Self-pay | Admitting: Nurse Practitioner

## 2020-07-05 ENCOUNTER — Other Ambulatory Visit: Payer: Self-pay

## 2020-07-05 VITALS — BP 137/87 | HR 70 | Temp 98.1°F | Ht 65.0 in | Wt 210.0 lb

## 2020-07-05 DIAGNOSIS — N281 Cyst of kidney, acquired: Secondary | ICD-10-CM

## 2020-07-05 DIAGNOSIS — R7303 Prediabetes: Secondary | ICD-10-CM

## 2020-07-05 LAB — POCT URINALYSIS DIPSTICK
Bilirubin, UA: NEGATIVE
Blood, UA: NEGATIVE
Glucose, UA: NEGATIVE
Ketones, UA: NEGATIVE
Leukocytes, UA: NEGATIVE
Nitrite, UA: NEGATIVE
Protein, UA: NEGATIVE
Spec Grav, UA: >=1.03 — AB (ref 1.010–1.025)
Urobilinogen, UA: 0.2 E.U./dL
pH, UA: 5.5 (ref 5.0–8.0)

## 2020-07-05 LAB — POCT GLYCOSYLATED HEMOGLOBIN (HGB A1C)
HbA1c POC (<> result, manual entry): 5.3 % (ref 4.0–5.6)
HbA1c, POC (controlled diabetic range): 5.3 % (ref 0.0–7.0)
HbA1c, POC (prediabetic range): 5.3 % — AB (ref 5.7–6.4)
Hemoglobin A1C: 5.3 % (ref 4.0–5.6)

## 2020-07-05 LAB — GLUCOSE, POCT (MANUAL RESULT ENTRY): POC Glucose: 137 mg/dl — AB (ref 70–99)

## 2020-07-05 NOTE — Progress Notes (Signed)
[facility] Patient [facility] [address] [place], [place]  [address] Phone:  [phone]   Fax:  [phone]   Established Patient Office Visit  Subjective:  Patient ID: Victor Gonzalez, male    DOB: 07-24-1985  Age: 35 y.o. MRN: [id number]  CC:  Chief Complaint  Patient presents with  . Follow-up    6 month follow up, history of kidney cyst 2019, he is waiting to have ultrasound to see if cyst is still present he tends to have some tenderness in that area.     HPI Victor Gonzalez presents for follow up. He  has a past medical history of Hypertension and Thyroid disease.   He is following up to in 4 months for a 6 month follow up. His A1c was 5.6% previous . He is SP appendectomy and was having some complications. A abdominal US was completed and it revealed a cyst to his left kidney. He want to have this evaluated. He is complaining of right flank pain.Denies headache, dizziness, visual changes, shortness of breath, dyspnea on exertion, chest pain, nausea, vomiting or any edema.   Past Medical History:  Diagnosis Date  . Hypertension   . Thyroid disease     Past Surgical History:  Procedure Laterality Date  . LAPAROSCOPIC APPENDECTOMY N/A 02/18/2020   Procedure: APPENDECTOMY LAPAROSCOPIC;  Surgeon: Diamantina Monks, MD;  Location: [facility] OR;  Service: General;  Laterality: N/A;  . SHOULDER SURGERY      History reviewed. No pertinent family history.  Social History   Socioeconomic History  . Marital status: Single    Spouse name: Not on file  . Number of children: Not on file  . Years of education: Not on file  . Highest education level: Not on file  Occupational History  . Not on file  Tobacco Use  . Smoking status: Never Smoker  . Smokeless tobacco: Never Used  Vaping Use  . Vaping Use: Never used  Substance and Sexual Activity  . Alcohol use: Yes    Comment: some days  . Drug use: No  . Sexual activity: Never  Other Topics Concern   . Not on file  Social History Narrative   ** Merged History Encounter **       Social Determinants of Health   Financial Resource Strain: Not on file  Food Insecurity: Not on file  Transportation Needs: Not on file  Physical Activity: Not on file  Stress: Not on file  Social Connections: Not on file  Intimate Partner Violence: Not on file    Outpatient Medications Prior to Visit  Medication Sig Dispense Refill  . acetaminophen (TYLENOL) 500 MG tablet Take 500-1,000 mg by mouth as needed (pain). (Patient not taking: Reported on 07/05/2020)    . albuterol (VENTOLIN HFA) 108 (90 Base) MCG/ACT inhaler Inhale 2 puffs into the lungs every 6 (six) hours as needed for wheezing or shortness of breath. (Patient not taking: No sig reported) 6.7 g 0  . ibuprofen (ADVIL) 800 MG tablet Take 1 tablet (800 mg total) by mouth every 8 (eight) hours as needed. (Patient not taking: Reported on 07/05/2020) 90 tablet 1  . oxyCODONE (ROXICODONE) 5 MG immediate release tablet Take 1 tablet (5 mg total) by mouth every 6 (six) hours as needed. (Patient not taking: Reported on 07/05/2020) 30 tablet 0   No facility-administered medications prior to visit.    No Known Allergies  ROS Review of Systems  Constitutional: Negative.   HENT: Negative.  Eyes: Negative.   Respiratory: Negative.   Cardiovascular: Negative.   Endocrine: Negative.   Genitourinary: Negative.   Musculoskeletal: Positive for arthralgias (hands).  Skin: Negative.   Allergic/Immunologic: Negative.   Neurological: Negative.   Hematological: Negative.   Psychiatric/Behavioral: Negative.       Objective:    Physical Exam Constitutional:      Appearance: He is obese.  HENT:     Head: Normocephalic and atraumatic.  Cardiovascular:     Rate and Rhythm: Normal rate and regular rhythm.     Pulses: Normal pulses.     Heart sounds: Normal heart sounds.  Pulmonary:     Effort: Pulmonary effort is normal.     Breath sounds: Normal  breath sounds.  Musculoskeletal:        General: Normal range of motion.     Cervical back: Normal range of motion.  Skin:    General: Skin is warm and dry.     Capillary Refill: Capillary refill takes less than 2 seconds.  Neurological:     General: No focal deficit present.     Mental Status: He is alert and oriented to person, place, and time.  Psychiatric:        Mood and Affect: Mood normal.        Behavior: Behavior normal.        Thought Content: Thought content normal.        Judgment: Judgment normal.     BP 137/87   Pulse 70   Temp 98.1 F (36.7 C)   Ht 5\' 5"  (1.651 m)   Wt 210 lb 0.2 oz (95.3 kg)   SpO2 100%   BMI 34.95 kg/m  Wt Readings from Last 3 Encounters:  07/05/20 210 lb 0.2 oz (95.3 kg)  03/01/20 205 lb 6.4 oz (93.2 kg)  02/29/20 204 lb (92.5 kg)     Health Maintenance Due  Topic Date Due  . COVID-19 Vaccine (1) Never done  . Hepatitis C Screening  Never done  . TETANUS/TDAP  Never done    There are no preventive care reminders to display for this patient.  No results found for: TSH Lab Results  Component Value Date   WBC 5.6 02/18/2020   HGB 14.5 02/18/2020   HCT 40.5 02/18/2020   MCV 90.8 02/18/2020   PLT 224 02/18/2020   Lab Results  Component Value Date   NA 140 02/18/2020   K 3.9 02/18/2020   CO2 23 02/18/2020   GLUCOSE 132 (H) 02/18/2020   BUN 17 02/18/2020   CREATININE 0.84 02/18/2020   BILITOT 1.0 02/17/2020   ALKPHOS 106 02/17/2020   AST 32 02/17/2020   ALT 42 02/17/2020   PROT 6.6 02/17/2020   ALBUMIN 4.3 02/17/2020   CALCIUM 8.4 (L) 02/18/2020   ANIONGAP 9 02/18/2020   No results found for: CHOL No results found for: HDL No results found for: LDLCALC No results found for: TRIG No results found for: CHOLHDL Lab Results  Component Value Date   HGBA1C 5.3 07/05/2020   HGBA1C 5.3 07/05/2020   HGBA1C 5.3 (A) 07/05/2020   HGBA1C 5.3 07/05/2020      Assessment & Plan:   Problem List Items Addressed This Visit       Other   Prediabetes - Primary Stable Consider home glucose monitoring Weight loss at least 5% of current body weight is can be achieved with lifestyle modification dietary changes and regular daily exercise Encourage blood pressure control goal <120/80 and maintaining total  cholesterol <200 Follow-up every 3 to 6 months for reevaluation Education material provided    Relevant Orders   HgB A1c (Completed)   Glucose (CBG) (Completed)   Urinalysis Dipstick (Completed)    Other Visit Diagnoses    Renal cyst    Left cyst seen on [place] pt requesting repeat US  Right flank pain    Relevant Orders   US Renal      No orders of the defined types were placed in this encounter.   Follow-up: Return in about 1 year (around 07/05/2021) for Physcial PREVENTIVE VISIT,EST,18-39 [[id number]].    Barbette Merino, NP

## 2020-07-05 NOTE — Patient Instructions (Addendum)
Mantenimiento de la salud en los hombres Health Maintenance, Male Adoptar un estilo de vida saludable y recibir atencin preventiva son importantes para promover la salud y el bienestar. Consulte al mdico sobre:  El esquema adecuado para hacerse pruebas y exmenes peridicos.  Cosas que puede hacer por su cuenta para prevenir enfermedades y mantenerse sano. Qu debo saber sobre la dieta, el peso y el ejercicio? Consuma una dieta saludable  Consuma una dieta que incluya muchas verduras, frutas, productos lcteos con bajo contenido de grasa y protenas magras.  No consuma muchos alimentos ricos en grasas slidas, azcares agregados o sodio.   Mantenga un peso saludable El ndice de masa muscular (IMC) es una medida que puede utilizarse para identificar posibles problemas de peso. Proporciona una estimacin de la grasa corporal basndose en el peso y la altura. Su mdico puede ayudarle a determinar su IMC y a lograr o mantener un peso saludable. Haga ejercicio con regularidad Haga ejercicio con regularidad. Esta es una de las prcticas ms importantes que puede hacer por su salud. La mayora de los adultos deben seguir estas pautas:  Realizar, al menos, 150minutos de actividad fsica por semana. El ejercicio debe aumentar la frecuencia cardaca y hacerlo transpirar (ejercicio de intensidad moderada).  Hacer ejercicios de fortalecimiento por lo menos dos veces por semana. Agregue esto a su plan de ejercicio de intensidad moderada.  Pasar menos tiempo sentados. Incluso la actividad fsica ligera puede ser beneficiosa. Controle sus niveles de colesterol y lpidos en la sangre Comience a realizarse anlisis de lpidos y colesterol en la sangre a los 20aos y luego reptalos cada 5aos. Es posible que necesite controlar los niveles de colesterol con mayor frecuencia si:  Sus niveles de lpidos y colesterol son altos.  Es mayor de 40aos.  Presenta un alto riesgo de padecer enfermedades  cardacas. Qu debo saber sobre las pruebas de deteccin del cncer? Muchos tipos de cncer pueden detectarse de manera temprana y, a menudo, pueden prevenirse. Segn su historia clnica y sus antecedentes familiares, es posible que deba realizarse pruebas de deteccin del cncer en diferentes edades. Esto puede incluir pruebas de deteccin de lo siguiente:  Cncer colorrectal.  Cncer de prstata.  Cncer de piel.  Cncer de pulmn. Qu debo saber sobre la enfermedad cardaca, la diabetes y la hipertensin arterial? Presin arterial y enfermedad cardaca  La hipertensin arterial causa enfermedades cardacas y aumenta el riesgo de accidente cerebrovascular. Es ms probable que esto se manifieste en las personas que tienen lecturas de presin arterial alta, tienen ascendencia africana o tienen sobrepeso.  Hable con el mdico sobre sus valores de presin arterial deseados.  Hgase controlar la presin arterial: ? Cada 3 a 5 aos si tiene entre 18 y 39 aos. ? Todos los aos si es mayor de 40aos.  Si tiene entre 65 y 75 aos y es fumador o sola fumar, pregntele al mdico si debe realizarse una prueba de deteccin de aneurisma artico abdominal (AAA) por nica vez. Diabetes Realcese exmenes de deteccin de la diabetes con regularidad. Este anlisis revisa el nivel de azcar en la sangre en ayunas. Hgase las pruebas de deteccin:  Cada tresaos despus de los 45aos de edad si tiene un peso normal y un bajo riesgo de padecer diabetes.  Con ms frecuencia y a partir de una edad inferior si tiene sobrepeso o un alto riesgo de padecer diabetes. Qu debo saber sobre la prevencin de infecciones? Hepatitis B Si tiene un riesgo ms alto de contraer hepatitis B, debe   someterse a un examen de deteccin de este virus. Hable con el mdico para averiguar si tiene riesgo de contraer la infeccin por hepatitis B. Hepatitis C Se recomienda un anlisis de sangre para:  Todos los que  nacieron entre 1945 y 1965.  Todas las personas que tengan un riesgo de haber contrado hepatitis C. Enfermedades de transmisin sexual (ETS)  Debe realizarse pruebas de deteccin de ITS todos los aos, incluidas la gonorrea y la clamidia, si: ? Es sexualmente activo y es menor de 24aos. ? Es mayor de 24aos, y el mdico le informa que corre riesgo de tener este tipo de infecciones. ? La actividad sexual ha cambiado desde que le hicieron la ltima prueba de deteccin y tiene un riesgo mayor de tener clamidia o gonorrea. Pregntele al mdico si usted tiene riesgo.  Pregntele al mdico si usted tiene un alto riesgo de contraer VIH. El mdico tambin puede recomendarle un medicamento recetado para ayudar a evitar la infeccin por el VIH. Si elige tomar medicamentos para prevenir el VIH, primero debe hacerse los anlisis de deteccin del VIH. Luego debe hacerse anlisis cada 3meses mientras est tomando los medicamentos. Siga estas instrucciones en su casa: Estilo de vida  No consuma ningn producto que contenga nicotina o tabaco, como cigarrillos, cigarrillos electrnicos y tabaco de mascar. Si necesita ayuda para dejar de fumar, consulte al mdico.  No consuma drogas.  No comparta agujas.  Solicite ayuda a su mdico si necesita apoyo o informacin para abandonar las drogas. Consumo de alcohol  No beba alcohol si el mdico se lo prohbe.  Si bebe alcohol: ? Limite la cantidad que consume de 0 a 2 medidas por da. ? Est atento a la cantidad de alcohol que hay en las bebidas que toma. En [place], una medida equivale a una botella de cerveza de 12oz (355ml), un vaso de vino de 5oz (148ml) o un vaso de una bebida alcohlica de alta graduacin de 1oz (44ml). Instrucciones generales  Realcese los estudios de rutina de la salud, dentales y de la vista.  Mantngase al da con las vacunas.  Infrmele a su mdico si: ? Se siente deprimido con frecuencia. ? Alguna vez  ha sido vctima de maltrato o no se siente seguro en su casa. Resumen  Adoptar un estilo de vida saludable y recibir atencin preventiva son importantes para promover la salud y el bienestar.  Siga las instrucciones del mdico acerca de una dieta saludable, el ejercicio y la realizacin de pruebas o exmenes para detectar enfermedades.  Siga las instrucciones del mdico con respecto al control del colesterol y la presin arterial. Esta informacin no tiene como fin reemplazar el consejo del mdico. Asegrese de hacerle al mdico cualquier pregunta que tenga. Document Revised: 03/04/2018 Document Reviewed: 03/04/2018 Elsevier Patient Education  2021 Elsevier Inc.  

## 2021-05-18 IMAGING — CR DG CHEST 2V
2 series · 2 of 2 positions shown · non-contrast
Comparison: Chest x-ray 11/19/2019.

CLINICAL DATA: 34-year-old male with history of COVID infection
complaining of persistent cough.

EXAM:
CHEST - 2 VIEW

[w chest pa]
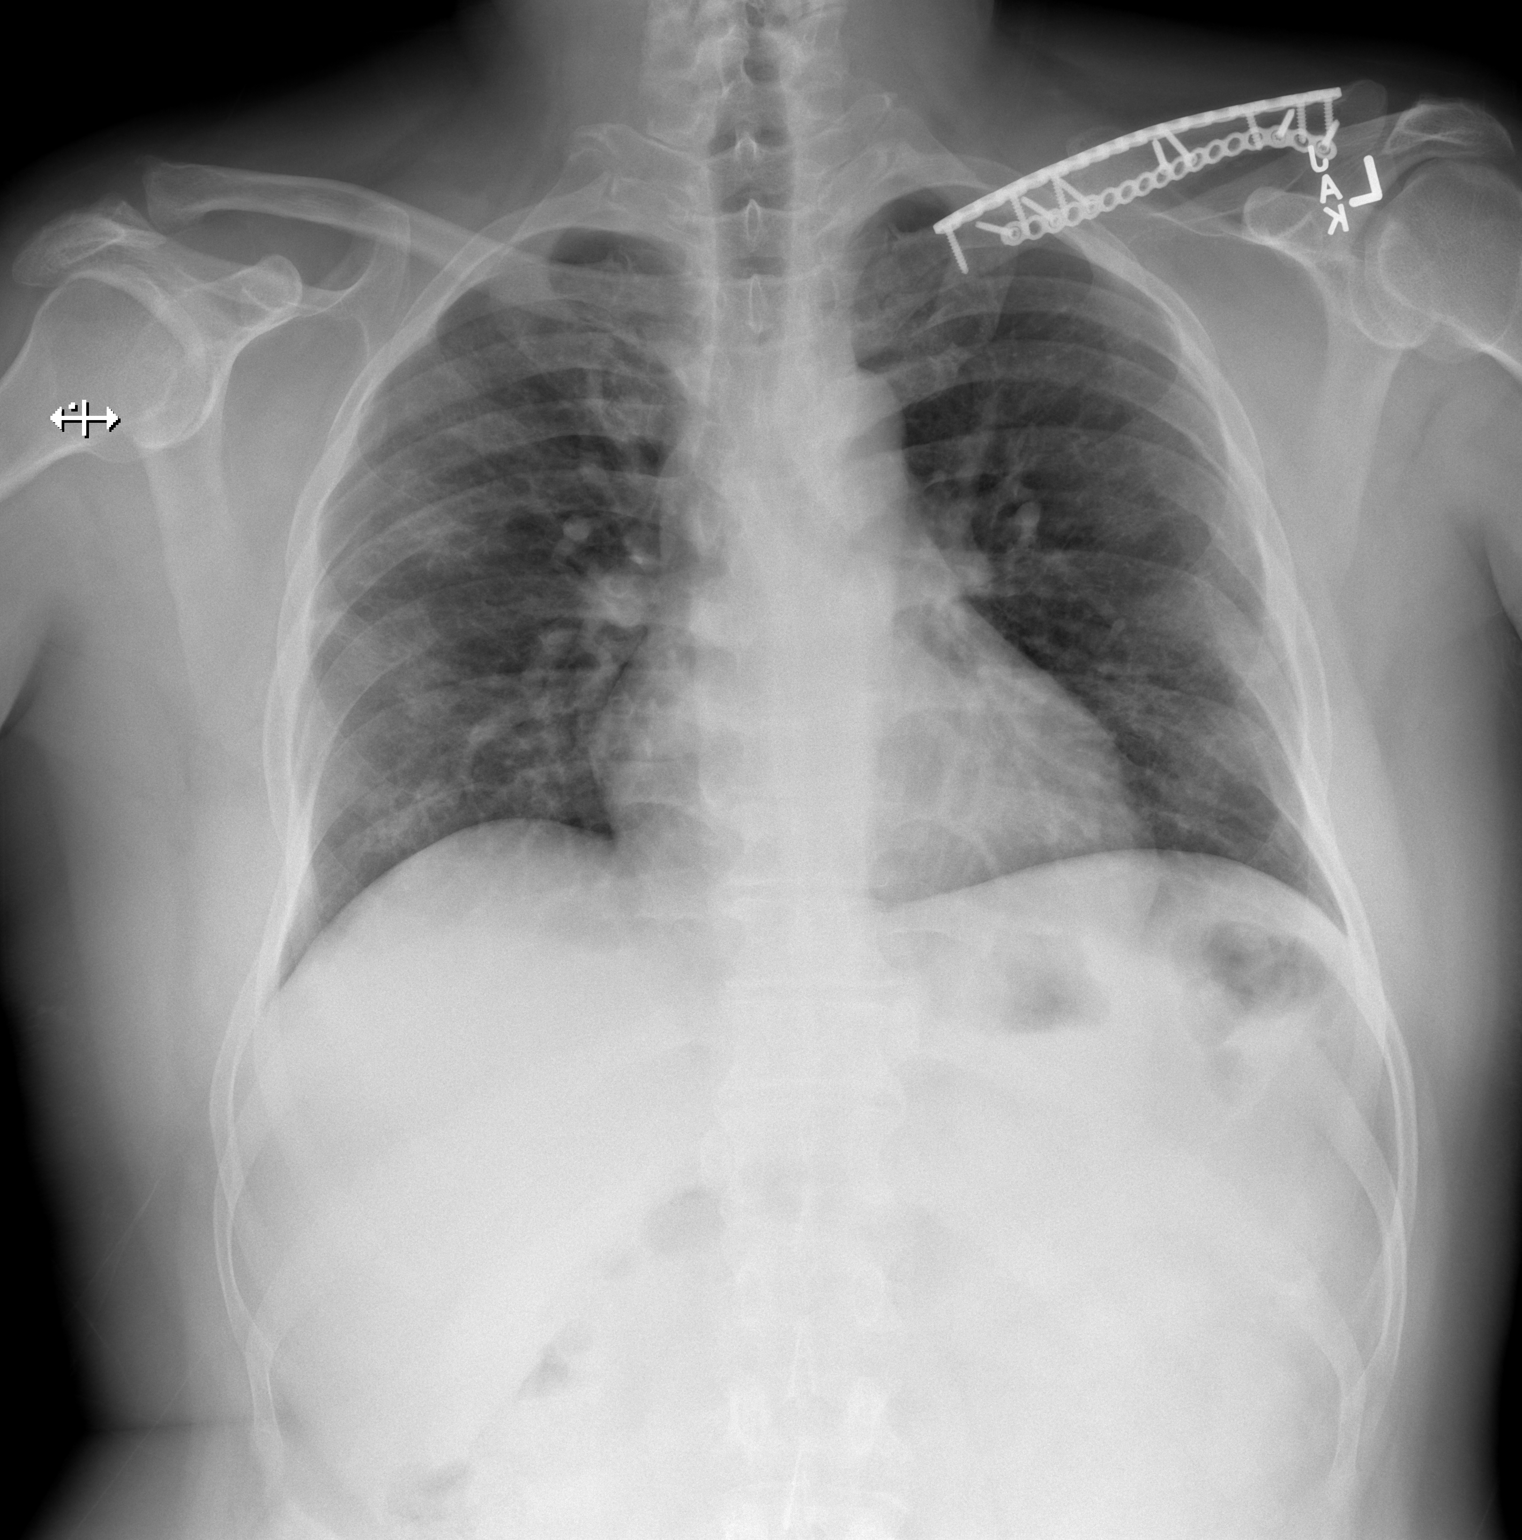

[w chest lat]
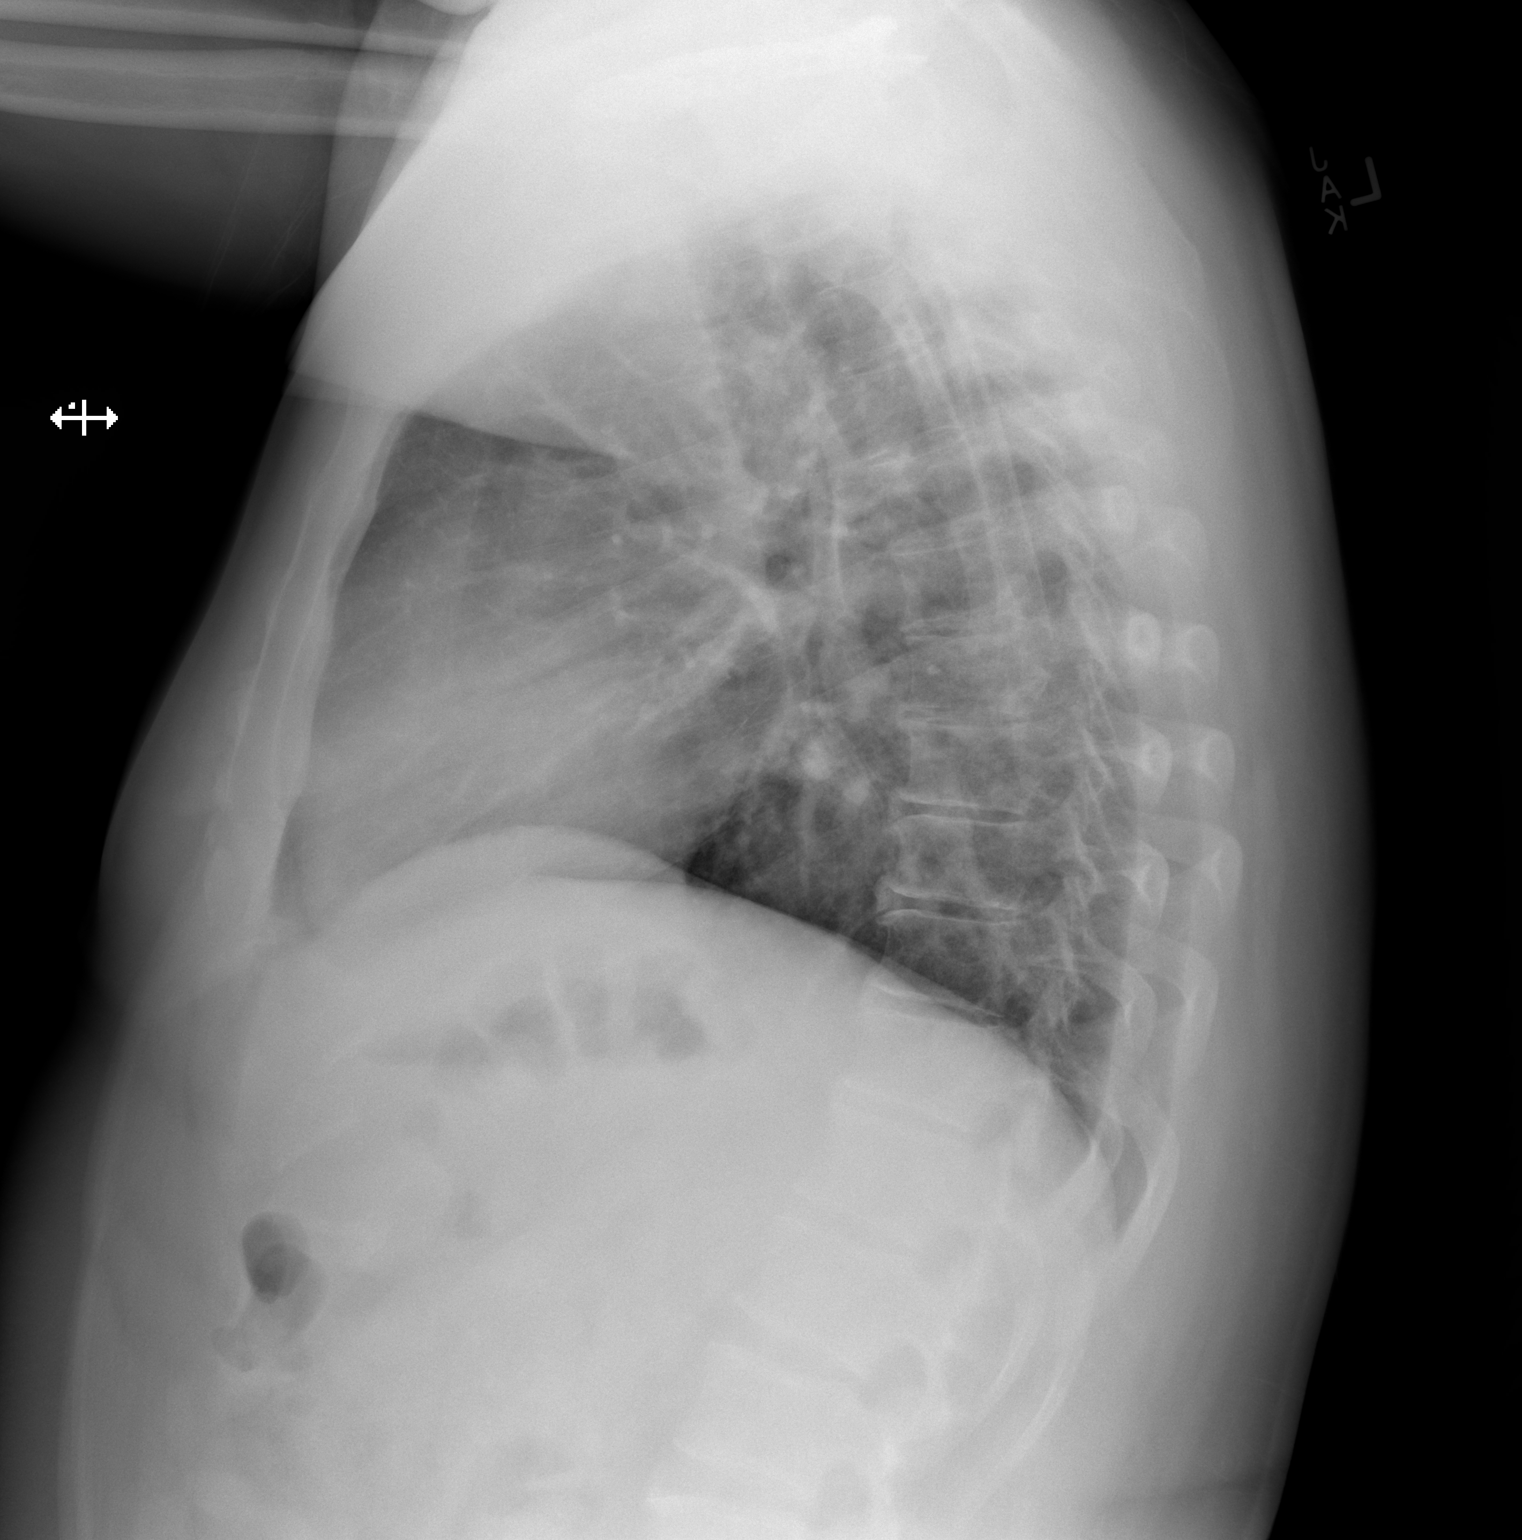

[2 of 2 positions shown; findings below may reference images not displayed]

FINDINGS: Lung volumes have slightly increased compared to the prior study.
There continues to be patchy ill-defined opacities and areas of
interstitial prominence throughout the lungs bilaterally, overall,
slightly improved compared to the prior study. No confluent
consolidative airspace disease. No pleural effusions. No
pneumothorax. No definite suspicious appearing pulmonary nodules or
masses are noted. No evidence of pulmonary edema. Heart size is
normal. Upper mediastinal contours are within normal limits.
Orthopedic fixation hardware in the left clavicle incidentally
noted.
IMPRESSION: 1. The appearance of the lungs is compatible with resolving
multilobar bilateral pneumonia, likely with evolving post infectious
or inflammatory fibrosis.

## 2021-07-05 ENCOUNTER — Ambulatory Visit: Payer: Self-pay | Admitting: Nurse Practitioner

## 2021-07-11 ENCOUNTER — Ambulatory Visit: Payer: Self-pay | Admitting: Nurse Practitioner

## 2021-08-14 IMAGING — US US ABDOMEN COMPLETE
1 series · 13 of 25 positions shown · non-contrast
Comparison: Prior CT from 02/17/2020.

CLINICAL DATA: Initial evaluation for left lower quadrant pain
status post surgery. History of prior appendectomy.

EXAM:
ABDOMEN ULTRASOUND COMPLETE

[Series 1: us abdomen complete · 13 of 109 slices shown]
[im 1/109]
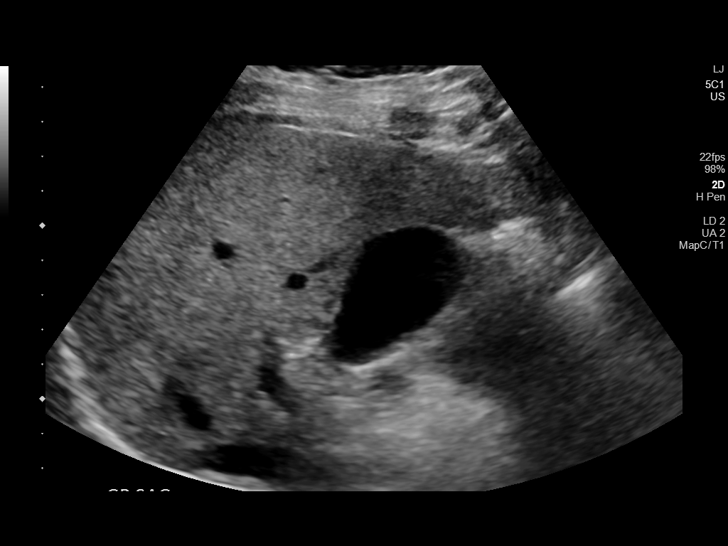
[im 10/109]
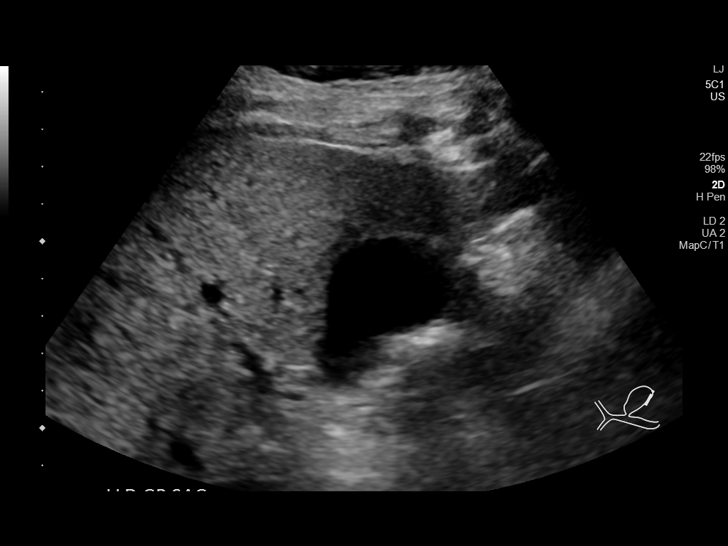
[im 19/109]
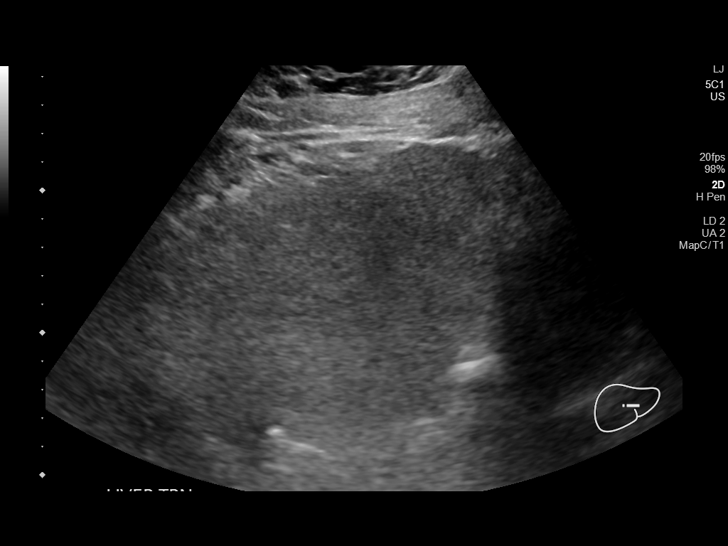
[im 28/109]
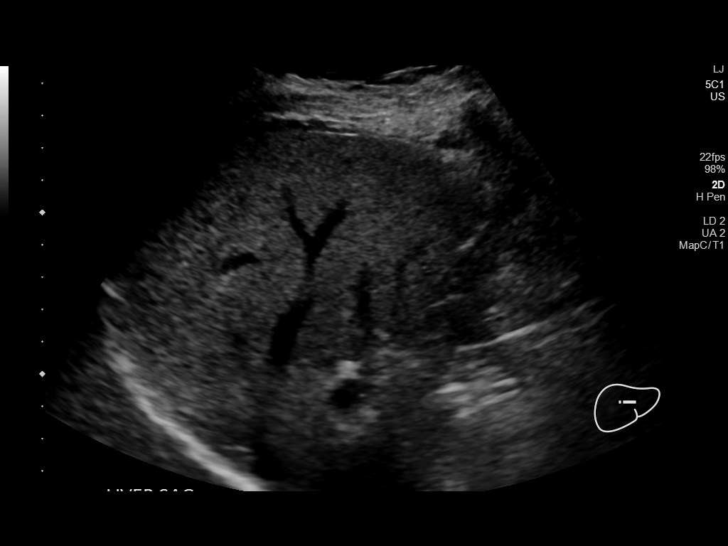
[im 37/109]
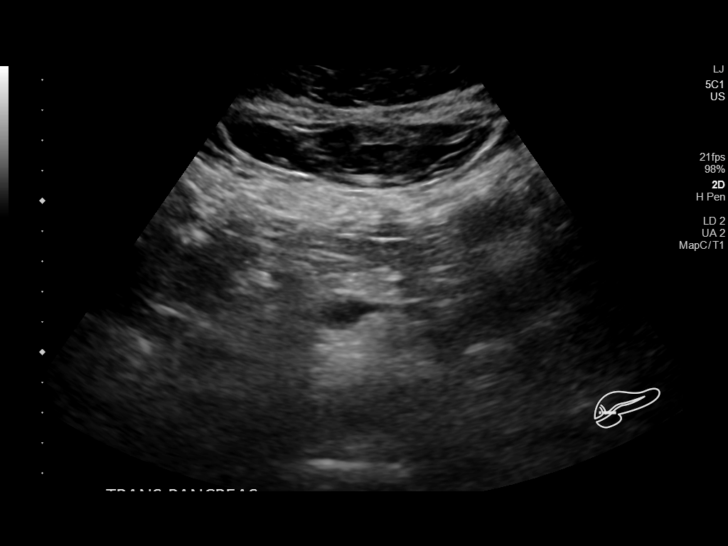
[im 46/109]
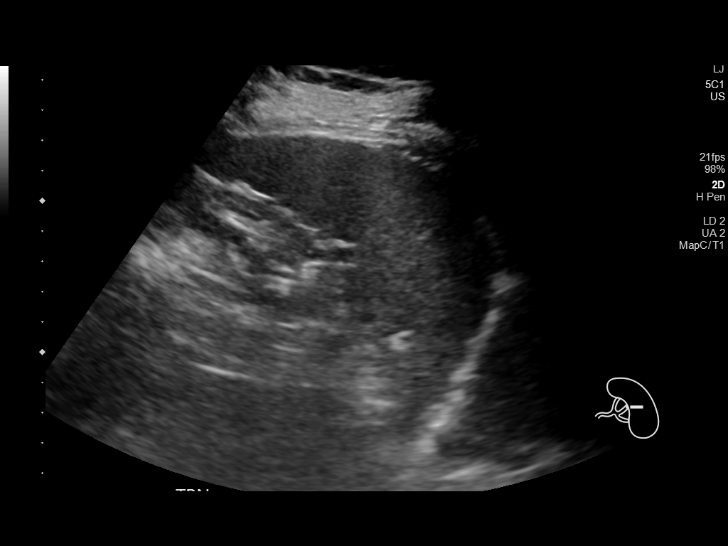
[im 55/109]
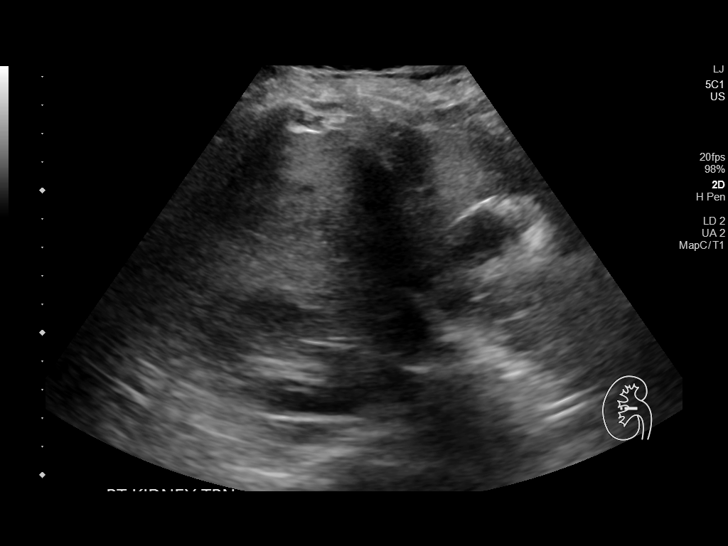
[im 64/109]
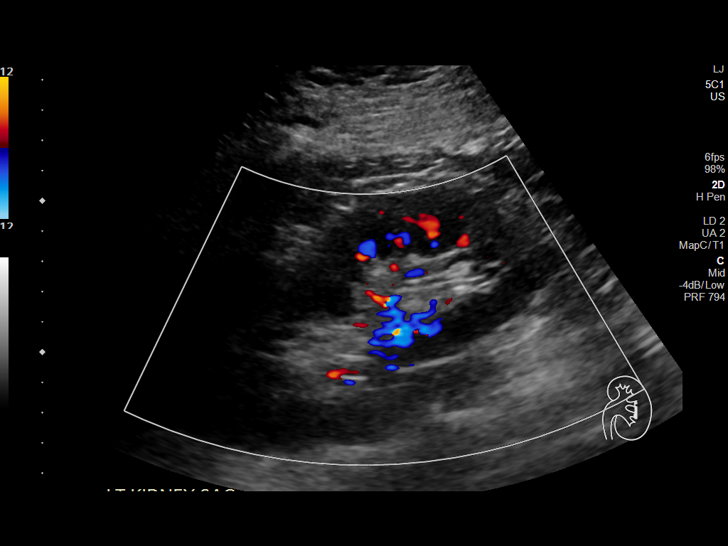
[im 73/109]
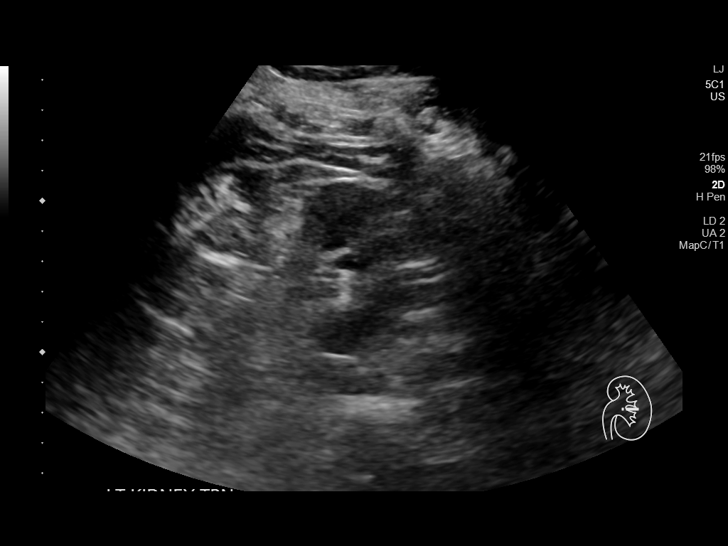
[im 82/109]
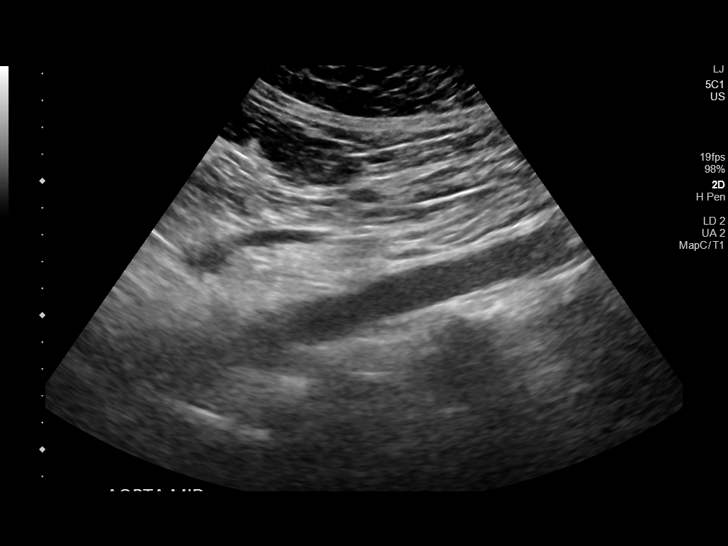
[im 91/109]
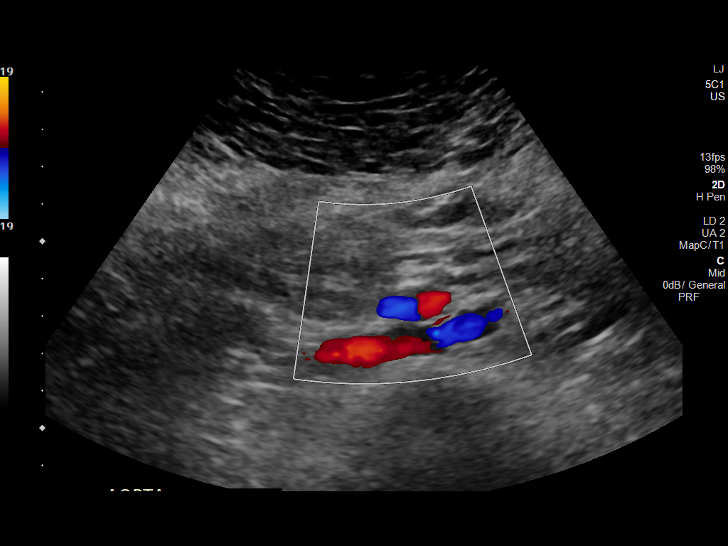
[im 100/109]
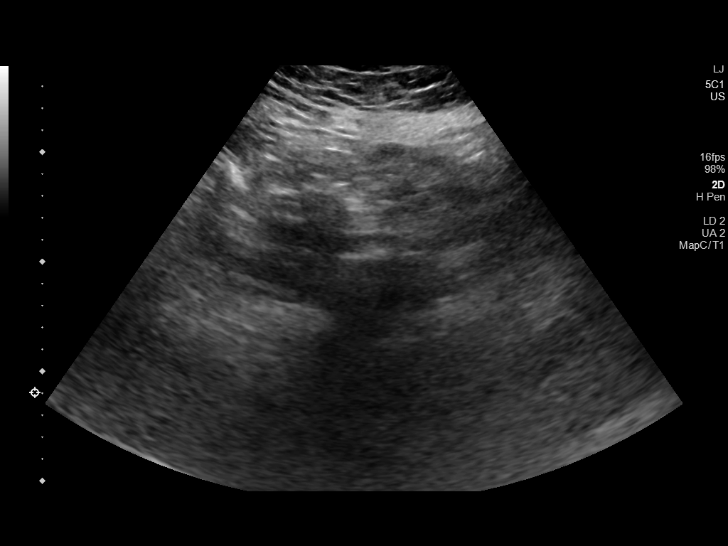
[im 109/109]
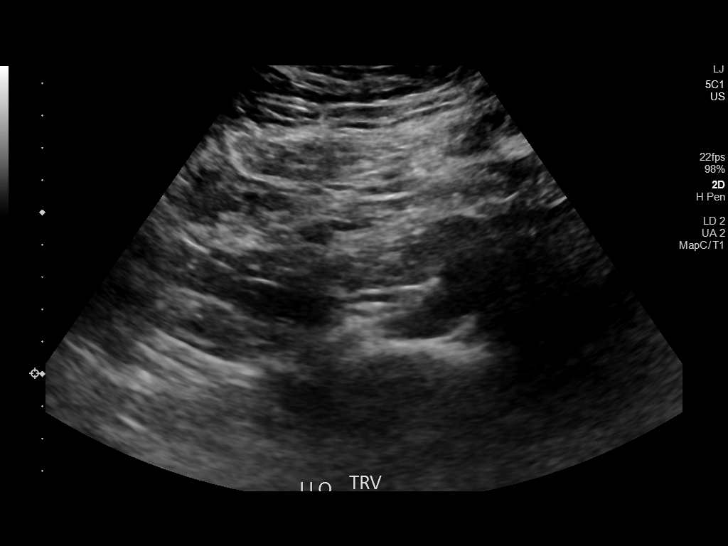

[13 of 25 positions shown; findings below may reference images not displayed]

FINDINGS: Gallbladder: No gallstones or wall thickening visualized. No
sonographic Murphy sign noted by sonographer.

Common bile duct: Diameter: 2 mm

Liver: No focal lesion identified. Heterogeneous increased
echogenicity within the hepatic parenchyma. Portal vein is patent on
color Doppler imaging with normal direction of blood flow towards
the liver.

IVC: No abnormality visualized.

Pancreas: Visualized portion unremarkable.

Spleen: Size and appearance within normal limits.

Right Kidney: Length: 12.0 cm. Echogenicity within normal limits. No
nephrolithiasis or hydronephrosis. No focal renal mass.

Left Kidney: Length: 12.0 cm. Echogenicity within normal limits. No
nephrolithiasis or hydronephrosis. 3.2 x 3.0 x 3.1 cm simple
exophytic cyst extends from the interpolar region.

Abdominal aorta: No aneurysm visualized.

Other findings: None.
IMPRESSION: 1. No acute abnormality within the abdomen. No findings to explain
patient's symptoms identified.
2. 3.2 cm simple exophytic cyst arising from the interpolar left
kidney.
3. Heterogeneous and increased echogenicity within the hepatic
parenchyma, nonspecific, but most commonly seen with steatosis.

## 2022-08-22 ENCOUNTER — Other Ambulatory Visit: Payer: Self-pay

## 2022-08-22 ENCOUNTER — Encounter (HOSPITAL_COMMUNITY): Payer: Self-pay | Admitting: *Deleted

## 2022-08-22 ENCOUNTER — Emergency Department (HOSPITAL_COMMUNITY)
Admission: EM | Admit: 2022-08-22 | Discharge: 2022-08-23 | Disposition: A | Discharge status: Home / Self Care | Payer: No Typology Code available for payment source | Attending: Emergency Medicine | Admitting: Emergency Medicine

## 2022-08-22 ENCOUNTER — Emergency Department (HOSPITAL_COMMUNITY): Payer: No Typology Code available for payment source

## 2022-08-22 ENCOUNTER — Emergency Department (HOSPITAL_COMMUNITY)
Admission: EM | Admit: 2022-08-22 | Discharge: 2022-08-22 | Disposition: A | Discharge status: Home / Self Care | Payer: No Typology Code available for payment source | Attending: Emergency Medicine | Admitting: Emergency Medicine

## 2022-08-22 DIAGNOSIS — N200 Calculus of kidney: Secondary | ICD-10-CM | POA: Insufficient documentation

## 2022-08-22 LAB — CBC
HCT: 41.4 % (ref 39.0–52.0)
Hemoglobin: 14.6 g/dL (ref 13.0–17.0)
MCH: 31.3 pg (ref 26.0–34.0)
MCHC: 35.3 g/dL (ref 30.0–36.0)
MCV: 88.7 fL (ref 80.0–100.0)
Platelets: 234 10*3/uL (ref 150–400)
RBC: 4.67 MIL/uL (ref 4.22–5.81)
RDW: 12.1 % (ref 11.5–15.5)
WBC: 5.7 10*3/uL (ref 4.0–10.5)
nRBC: 0 % (ref 0.0–0.2)

## 2022-08-22 LAB — COMPREHENSIVE METABOLIC PANEL
ALT: 39 U/L (ref 0–44)
AST: 27 U/L (ref 15–41)
Albumin: 4 g/dL (ref 3.5–5.0)
Alkaline Phosphatase: 97 U/L (ref 38–126)
Anion gap: 9 (ref 5–15)
BUN: 14 mg/dL (ref 6–20)
CO2: 24 mmol/L (ref 22–32)
Calcium: 8.7 mg/dL — ABNORMAL LOW (ref 8.9–10.3)
Chloride: 105 mmol/L (ref 98–111)
Creatinine, Ser: 0.82 mg/dL (ref 0.61–1.24)
GFR, Estimated: >60 mL/min (ref >60)
Glucose, Bld: 110 mg/dL — ABNORMAL HIGH (ref 70–99)
Potassium: 3.2 mmol/L — ABNORMAL LOW (ref 3.5–5.1)
Sodium: 138 mmol/L (ref 135–145)
Total Bilirubin: 1.3 mg/dL — ABNORMAL HIGH (ref 0.3–1.2)
Total Protein: 6.7 g/dL (ref 6.5–8.1)

## 2022-08-22 LAB — URINALYSIS, MICROSCOPIC (REFLEX)
RBC / HPF: >50 RBC/hpf (ref 0–5)
Squamous Epithelial / HPF: NONE SEEN /HPF (ref 0–5)

## 2022-08-22 LAB — URINALYSIS, ROUTINE W REFLEX MICROSCOPIC
Glucose, UA: NEGATIVE mg/dL
Ketones, ur: NEGATIVE mg/dL
Leukocytes,Ua: NEGATIVE
Nitrite: NEGATIVE
Protein, ur: 30 mg/dL — AB
Specific Gravity, Urine: >1.03 — ABNORMAL HIGH (ref 1.005–1.030)
pH: 5.5 (ref 5.0–8.0)

## 2022-08-22 LAB — LIPASE, BLOOD: Lipase: 33 U/L (ref 11–51)

## 2022-08-22 MED ORDER — FENTANYL CITRATE PF 50 MCG/ML IJ SOSY
50.0000 ug | PREFILLED_SYRINGE | Freq: Once | INTRAMUSCULAR | Status: AC
  Administered 2022-08-22: 50 ug via INTRAVENOUS
  Filled 2022-08-22 (×2): qty 1

## 2022-08-22 MED ORDER — SODIUM CHLORIDE 0.9 % IV SOLN
1.5000 mg/kg | Freq: Once | INTRAVENOUS | Status: AC
  Administered 2022-08-22: 134 mg via INTRAVENOUS
  Filled 2022-08-22: qty 6.7

## 2022-08-22 MED ORDER — ONDANSETRON HCL 4 MG/2ML IJ SOLN
4.0000 mg | Freq: Once | INTRAMUSCULAR | Status: AC
  Administered 2022-08-22: 4 mg via INTRAVENOUS
  Filled 2022-08-22: qty 2

## 2022-08-22 MED ORDER — MORPHINE SULFATE (PF) 4 MG/ML IV SOLN
4.0000 mg | Freq: Once | INTRAVENOUS | Status: AC
  Administered 2022-08-22: 4 mg via INTRAVENOUS
  Filled 2022-08-22: qty 1

## 2022-08-22 MED ORDER — ONDANSETRON 4 MG PO TBDP: 4.0000 mg | ORAL_TABLET | Freq: Three times a day (TID) | ORAL | 0 refills | Status: AC | PRN

## 2022-08-22 MED ORDER — IBUPROFEN 400 MG PO TABS: 400.0000 mg | ORAL_TABLET | Freq: Three times a day (TID) | ORAL | 0 refills | Status: AC | PRN

## 2022-08-22 MED ORDER — HYDROCODONE-ACETAMINOPHEN 5-325 MG PO TABS: 1.0000 | ORAL_TABLET | Freq: Four times a day (QID) | ORAL | 0 refills | Status: AC | PRN

## 2022-08-22 MED ORDER — KETOROLAC TROMETHAMINE 30 MG/ML IJ SOLN
15.0000 mg | Freq: Once | INTRAMUSCULAR | Status: AC
  Administered 2022-08-22: 15 mg via INTRAVENOUS
  Filled 2022-08-22: qty 1

## 2022-08-22 MED ORDER — KETOROLAC TROMETHAMINE 30 MG/ML IJ SOLN
30.0000 mg | Freq: Once | INTRAMUSCULAR | Status: AC
  Administered 2022-08-22: 30 mg via INTRAVENOUS
  Filled 2022-08-22: qty 1

## 2022-08-22 NOTE — ED Triage Notes (Addendum)
Pt seen here earlier today, dx with kidney stones and sent home with PO pain medications. Reports pain worsening after returning home. Has had little urine output since pain started and has noticed blood in urine. Also reports bilateral hand numbness with no other neuro deficits

## 2022-08-22 NOTE — ED Provider Notes (Signed)
[place] EMERGENCY DEPARTMENT AT [facility] Provider Note   CSN: [id number] Arrival date & time: 08/22/22  2205     History  Chief Complaint  Patient presents with   Nephrolithiasis    Victor Gonzalez is a 37 y.o. male.  The history is provided by the patient and medical records.   37 year old male presenting to the ED with flank pain.  Seen in the ED earlier today and diagnosed with 1.4 mm right distal ureteral stone.  States he was doing okay for a while but pain significantly worsened this evening.  He did have some nausea and vomiting at home.  States for the past several hours he has not been able to urinate.  Last time he did urinate there was some blood.  He denies any fever or chills.  He was given Toradol and fentanyl in triage and reports pain has significantly improved, however still does not feel like he can relax to urinate.  Home Medications Prior to Admission medications   Medication Sig Start Date End Date Taking? Authorizing Provider  acetaminophen (TYLENOL) 500 MG tablet Take 500-1,000 mg by mouth as needed (pain). Patient not taking: Reported on 07/05/2020    [provider]  albuterol (VENTOLIN HFA) 108 (90 Base) MCG/ACT inhaler Inhale 2 puffs into the lungs every 6 (six) hours as needed for wheezing or shortness of breath. Patient not taking: No sig reported 11/25/19   Leroy Sea, MD  HYDROcodone-acetaminophen (NORCO/VICODIN) 5-325 MG tablet Take 1 tablet by mouth every 6 (six) hours as needed. 08/22/22   Gerhard Munch, MD  ibuprofen (ADVIL) 400 MG tablet Take 1 tablet (400 mg total) by mouth every 8 (eight) hours as needed for up to 3 days for moderate pain. 08/22/22 08/25/22  Gerhard Munch, MD  ondansetron (ZOFRAN-ODT) 4 MG disintegrating tablet Take 1 tablet (4 mg total) by mouth every 8 (eight) hours as needed for nausea or vomiting. 08/22/22   Gerhard Munch, MD  oxyCODONE (ROXICODONE) 5 MG immediate release tablet Take  1 tablet (5 mg total) by mouth every 6 (six) hours as needed. Patient not taking: Reported on 07/05/2020 02/18/20   Diamantina Monks, MD      Allergies    Patient has no known allergies.    Review of Systems   Review of Systems  Genitourinary:  Positive for difficulty urinating and flank pain.  All other systems reviewed and are negative.   Physical Exam Updated Vital Signs BP (!) 153/105 (BP Location: Right Arm)   Pulse 70   Temp 97.9 F (36.6 C) (Oral)   Resp 18   Ht 5\' 6"  (1.676 m)   Wt 89.8 kg   SpO2 100%   BMI 31.96 kg/m   Physical Exam Vitals and nursing note reviewed.  Constitutional:      Appearance: He is well-developed.     Comments: NAD  HENT:     Head: Normocephalic and atraumatic.  Eyes:     Conjunctiva/sclera: Conjunctivae normal.     Pupils: Pupils are equal, round, and reactive to light.  Cardiovascular:     Rate and Rhythm: Normal rate and regular rhythm.     Heart sounds: Normal heart sounds.  Pulmonary:     Effort: Pulmonary effort is normal.     Breath sounds: Normal breath sounds.  Abdominal:     General: Bowel sounds are normal.     Palpations: Abdomen is soft.  Musculoskeletal:        General: Normal range  of motion.     Cervical back: Normal range of motion.  Skin:    General: Skin is warm and dry.  Neurological:     Mental Status: He is alert and oriented to person, place, and time.     ED Results / Procedures / Treatments   Labs (all labs ordered are listed, but only abnormal results are displayed) Labs Reviewed - No data to display  EKG None  Radiology CT Renal Stone Study  Result Date: 08/22/2022 CLINICAL DATA:  Abdominal/flank pain, stone suspected. Right flank pain. EXAM: CT ABDOMEN AND PELVIS WITHOUT CONTRAST TECHNIQUE: Multidetector CT imaging of the abdomen and pelvis was performed following the standard protocol without IV contrast. RADIATION DOSE REDUCTION: This exam was performed according to the departmental  dose-optimization program which includes automated exposure control, adjustment of the mA and/or kV according to patient size and/or use of iterative reconstruction technique. COMPARISON:  CT examination dated February 17, 2020 FINDINGS: Lower chest: No acute abnormality. Hepatobiliary: No focal liver abnormality is seen. No gallstones, gallbladder wall thickening, or biliary dilatation. Pancreas: Unremarkable. No pancreatic ductal dilatation or surrounding inflammatory changes. Spleen: Normal in size without focal abnormality. Adrenals/Urinary Tract: Adrenal glands are unremarkable. There is a punctate calculus in the distal right ureter at the ureterovesical junction measuring up to 1.4 mm and another calculus in the proximal left ureter measuring 2 mm. No hydronephrosis. 4 cm simple cysts in the lateral aspect of the left kidney, unchanged. Bladder is unremarkable. Stomach/Bowel: Stomach is within normal limits. Postsurgical changes at the cecal base. No evidence of bowel wall thickening, distention, or inflammatory changes. Vascular/Lymphatic: No significant vascular findings are present. No enlarged abdominal or pelvic lymph nodes. Reproductive: Prostate is unremarkable. Other: No abdominal wall hernia or abnormality. No abdominopelvic ascites. Musculoskeletal: Mild multilevel degenerate disc disease of the lumbar spine. No acute osseous abnormality. IMPRESSION: There is a punctate calculus in the distal right ureter at the ureterovesical junction measuring up to 1.4 mm and another calculus in the proximal left ureter measuring 2 mm. No appreciable hydronephrosis. Correlation with urinalysis is suggested. Electronically Signed   By: Larose Hires D.O.   On: 08/22/2022 14:31    Procedures Procedures    Medications Ordered in ED Medications  ketorolac (TORADOL) 30 MG/ML injection 30 mg (30 mg Intravenous Given 08/22/22 2229)  fentaNYL (SUBLIMAZE) injection 50 mcg (50 mcg Intravenous Given 08/22/22 2228)   lidocaine (XYLOCAINE) 134 mg in sodium chloride 0.9 % 100 mL IVPB (0 mg Intravenous Stopped 08/22/22 2359)  HYDROmorphone (DILAUDID) injection 1 mg (1 mg Intravenous Given 08/23/22 0007)    ED Course/ Medical Decision Making/ A&P                             Medical Decision Making Risk Prescription drug management.   37 year old male here with right flank pain.  He was seen in the ED earlier today and diagnosed with right UVJ stone.  Has also had some difficulty urinating for the past few hours.  Patient was given Toradol and fentanyl in triage with relief of his pain, however still does not feel like he can urinate.  Will give lidocaine infusion.  As he just had labs and CT earlier today, do not feel these need to be repeated currently.  12:03 AM After lidocaine infusion patient was able to urinate, did have some hematuria which is not unexpected.  Pain increasing again, now 6/10.  Given dose of dilaudid.  12:33 AM Patient feeling better now.  Still has some mild pain.  We have discussed that he likely will still have some discomfort and may not be entirely pain free.  He was prescribed hydrocodone but not giving him any relief, will switch to oxycodone and add flomax.  Encouraged to follow-up with urology.  Can return here for new concerns.  Final Clinical Impression(s) / ED Diagnoses Final diagnoses:  Nephrolithiasis    Rx / DC Orders ED Discharge Orders          Ordered    oxyCODONE (ROXICODONE) 5 MG immediate release tablet  Every 4 hours PRN        08/23/22 0034    tamsulosin (FLOMAX) 0.4 MG CAPS capsule  Daily after supper        08/23/22 0034              Garlon Hatchet, PA-C 08/23/22 0102    Linwood Dibbles, MD 08/23/22 [phone]

## 2022-08-22 NOTE — ED Provider Notes (Signed)
[place] EMERGENCY DEPARTMENT AT [facility] Provider Note   CSN: [id number] Arrival date & time: 08/22/22  1132     History  Chief Complaint  Patient presents with   Abdominal Pain    Victor Gonzalez is a 37 y.o. male.  HPI Patient presents with acute onset right lower quadrant abdominal pain, right flank pain.  He has pain with urination, but no hematuria, no penis or scrotal abnormalities. Patient has a history of prior appendectomy, but is otherwise generally well. Onset was today.    Home Medications Prior to Admission medications   Medication Sig Start Date End Date Taking? Authorizing Provider  HYDROcodone-acetaminophen (NORCO/VICODIN) 5-325 MG tablet Take 1 tablet by mouth every 6 (six) hours as needed. 08/22/22  Yes Gerhard Munch, MD  ondansetron (ZOFRAN-ODT) 4 MG disintegrating tablet Take 1 tablet (4 mg total) by mouth every 8 (eight) hours as needed for nausea or vomiting. 08/22/22  Yes Gerhard Munch, MD  acetaminophen (TYLENOL) 500 MG tablet Take 500-1,000 mg by mouth as needed (pain). Patient not taking: Reported on 07/05/2020    [provider]  albuterol (VENTOLIN HFA) 108 (90 Base) MCG/ACT inhaler Inhale 2 puffs into the lungs every 6 (six) hours as needed for wheezing or shortness of breath. Patient not taking: No sig reported 11/25/19   Leroy Sea, MD  ibuprofen (ADVIL) 400 MG tablet Take 1 tablet (400 mg total) by mouth every 8 (eight) hours as needed for up to 3 days for moderate pain. 08/22/22 08/25/22  Gerhard Munch, MD  oxyCODONE (ROXICODONE) 5 MG immediate release tablet Take 1 tablet (5 mg total) by mouth every 6 (six) hours as needed. Patient not taking: Reported on 07/05/2020 02/18/20   Diamantina Monks, MD      Allergies    Patient has no known allergies.    Review of Systems   Review of Systems  All other systems reviewed and are negative.   Physical Exam Updated Vital Signs BP 113/79   Pulse (!) 46    Temp (!) 97.1 F (36.2 C)   Resp 18   SpO2 99%  Physical Exam Vitals and nursing note reviewed.  Constitutional:      General: He is not in acute distress.    Appearance: He is well-developed.  HENT:     Head: Normocephalic and atraumatic.  Eyes:     Conjunctiva/sclera: Conjunctivae normal.  Cardiovascular:     Rate and Rhythm: Normal rate and regular rhythm.  Pulmonary:     Effort: Pulmonary effort is normal. No respiratory distress.     Breath sounds: No stridor.  Abdominal:     General: There is no distension.     Tenderness: There is abdominal tenderness in the right lower quadrant and suprapubic area.  Skin:    General: Skin is warm and dry.  Neurological:     Mental Status: He is alert and oriented to person, place, and time.     ED Results / Procedures / Treatments   Labs (all labs ordered are listed, but only abnormal results are displayed) Labs Reviewed  COMPREHENSIVE METABOLIC PANEL - Abnormal; Notable for the following components:      Result Value   Potassium 3.2 (*)    Glucose, Bld 110 (*)    Calcium 8.7 (*)    Total Bilirubin 1.3 (*)    All other components within normal limits  URINALYSIS, ROUTINE W REFLEX MICROSCOPIC - Abnormal; Notable for the following components:   Color, Urine  AMBER (*)    APPearance HAZY (*)    Specific Gravity, Urine >1.030 (*)    Hgb urine dipstick LARGE (*)    Bilirubin Urine SMALL (*)    Protein, ur 30 (*)    All other components within normal limits  URINALYSIS, MICROSCOPIC (REFLEX) - Abnormal; Notable for the following components:   Bacteria, UA RARE (*)    All other components within normal limits  LIPASE, BLOOD  CBC    EKG None  Radiology CT Renal Stone Study  Result Date: 08/22/2022 CLINICAL DATA:  Abdominal/flank pain, stone suspected. Right flank pain. EXAM: CT ABDOMEN AND PELVIS WITHOUT CONTRAST TECHNIQUE: Multidetector CT imaging of the abdomen and pelvis was performed following the standard protocol  without IV contrast. RADIATION DOSE REDUCTION: This exam was performed according to the departmental dose-optimization program which includes automated exposure control, adjustment of the mA and/or kV according to patient size and/or use of iterative reconstruction technique. COMPARISON:  CT examination dated February 17, 2020 FINDINGS: Lower chest: No acute abnormality. Hepatobiliary: No focal liver abnormality is seen. No gallstones, gallbladder wall thickening, or biliary dilatation. Pancreas: Unremarkable. No pancreatic ductal dilatation or surrounding inflammatory changes. Spleen: Normal in size without focal abnormality. Adrenals/Urinary Tract: Adrenal glands are unremarkable. There is a punctate calculus in the distal right ureter at the ureterovesical junction measuring up to 1.4 mm and another calculus in the proximal left ureter measuring 2 mm. No hydronephrosis. 4 cm simple cysts in the lateral aspect of the left kidney, unchanged. Bladder is unremarkable. Stomach/Bowel: Stomach is within normal limits. Postsurgical changes at the cecal base. No evidence of bowel wall thickening, distention, or inflammatory changes. Vascular/Lymphatic: No significant vascular findings are present. No enlarged abdominal or pelvic lymph nodes. Reproductive: Prostate is unremarkable. Other: No abdominal wall hernia or abnormality. No abdominopelvic ascites. Musculoskeletal: Mild multilevel degenerate disc disease of the lumbar spine. No acute osseous abnormality. IMPRESSION: There is a punctate calculus in the distal right ureter at the ureterovesical junction measuring up to 1.4 mm and another calculus in the proximal left ureter measuring 2 mm. No appreciable hydronephrosis. Correlation with urinalysis is suggested. Electronically Signed   By: Larose Hires D.O.   On: 08/22/2022 14:31    Procedures Procedures    Medications Ordered in ED Medications  ondansetron (ZOFRAN) injection 4 mg (4 mg Intravenous Given  08/22/22 1218)  morphine (PF) 4 MG/ML injection 4 mg (4 mg Intravenous Given 08/22/22 1218)  ketorolac (TORADOL) 30 MG/ML injection 15 mg (15 mg Intravenous Given 08/22/22 1537)    ED Course/ Medical Decision Making/ A&P                             Medical Decision Making Patient with history of prior appendectomy presents with acute right-sided abdominal pain, dysuria concern for infection versus kidney stone.  Patient received fluids, analgesics, antiemetics. CT consistent with small distal stone, patient's status essentially resolved with analgesics, including Toradol.  No evidence for infection, bacteremia, sepsis.  Patient discharged in stable condition to follow-up with urology as needed.  Amount and/or Complexity of Data Reviewed Labs: ordered. Decision-making details documented in ED Course. Radiology: ordered and independent interpretation performed. Decision-making details documented in ED Course.  Risk Prescription drug management.   Patient in no distress, awake, alert, aware of all findings.        Final Clinical Impression(s) / ED Diagnoses Final diagnoses:  Nephrolithiasis    Rx / DC Orders  ED Discharge Orders          Ordered    ibuprofen (ADVIL) 400 MG tablet  Every 8 hours PRN        08/22/22 1610    ondansetron (ZOFRAN-ODT) 4 MG disintegrating tablet  Every 8 hours PRN        08/22/22 1610    HYDROcodone-acetaminophen (NORCO/VICODIN) 5-325 MG tablet  Every 6 hours PRN        08/22/22 1610              Gerhard Munch, MD 08/22/22 1610

## 2022-08-22 NOTE — ED Triage Notes (Signed)
Patient reports RLQ and R flank pain since this morning. Denies n/v/d. Pain constant since onset, except somewhat relieved by tylenol.

## 2022-08-23 ENCOUNTER — Telehealth: Payer: Self-pay

## 2022-08-23 MED ORDER — OXYCODONE HCL 5 MG PO TABS: 5.0000 mg | ORAL_TABLET | ORAL | 0 refills | Status: AC | PRN

## 2022-08-23 MED ORDER — TAMSULOSIN HCL 0.4 MG PO CAPS: 0.4000 mg | ORAL_CAPSULE | Freq: Every day | ORAL | 0 refills | Status: DC

## 2022-08-23 MED ORDER — HYDROMORPHONE HCL 1 MG/ML IJ SOLN
1.0000 mg | Freq: Once | INTRAMUSCULAR | Status: AC
  Administered 2022-08-23: 1 mg via INTRAVENOUS
  Filled 2022-08-23: qty 1

## 2022-08-23 NOTE — Discharge Instructions (Signed)
Stop taking the hydrocodone and start the oxycodone-- this is a stronger pain medication.  The flomax will help with urination as well. Follow-up with urology-- call in the morning for appt. Return to the ED for new or worsening symptoms.

## 2022-08-23 NOTE — Transitions of Care (Post Inpatient/ED Visit) (Cosign Needed)
   08/23/2022  Name: Victor Gonzalez MRN: [id number] DOB: 07-May-1985  Today's TOC FU Call Status: Today's TOC FU Call Status:: Successful TOC FU Call Competed TOC FU Call Complete Date: 08/23/22  Transition Care Management Follow-up Telephone Call Date of Discharge: 08/23/22 Discharge Facility: [facility]) Type of Discharge: Emergency Department Reason for ED Visit: Other: (kidney stone) How have you been since you were released from the hospital?: Better Any questions or concerns?: No  Items Reviewed: Did you receive and understand the discharge instructions provided?: Yes Medications obtained,verified, and reconciled?: Yes (Medications Reviewed) Any new allergies since your discharge?: No Dietary orders reviewed?: NA Do you have support at home?: Yes People in Home: other relative(s)  Medications Reviewed Today: Medications Reviewed Today     Reviewed by Renelda Loma, RMA (Registered Medical Assistant) on 08/23/22 at 1046  Med List Status: <None>   Medication Order Taking? Sig Documenting Provider Last Dose Status Informant  acetaminophen (TYLENOL) 500 MG tablet [id number] No Take 500-1,000 mg by mouth as needed (pain).  Patient not taking: Reported on 07/05/2020   [provider] Not Taking Active Self  albuterol (VENTOLIN HFA) 108 (90 Base) MCG/ACT inhaler [id number] No Inhale 2 puffs into the lungs every 6 (six) hours as needed for wheezing or shortness of breath.  Patient not taking: Reported on 01/06/2020   Leroy Sea, MD Not Taking Active Self  HYDROcodone-acetaminophen (NORCO/VICODIN) 5-325 MG tablet [id number] Yes Take 1 tablet by mouth every 6 (six) hours as needed. Gerhard Munch, MD Taking Active   ibuprofen (ADVIL) 400 MG tablet [id number] Yes Take 1 tablet (400 mg total) by mouth every 8 (eight) hours as needed for up to 3 days for moderate pain. Gerhard Munch, MD Taking Active   ondansetron (ZOFRAN-ODT) 4 MG disintegrating tablet  [id number] Yes Take 1 tablet (4 mg total) by mouth every 8 (eight) hours as needed for nausea or vomiting. Gerhard Munch, MD Taking Active   oxyCODONE (ROXICODONE) 5 MG immediate release tablet [id number] Yes Take 1 tablet (5 mg total) by mouth every 4 (four) hours as needed for severe pain. Garlon Hatchet, PA-C Taking Active   tamsulosin [facility]) 0.4 MG CAPS capsule [id number] Yes Take 1 capsule (0.4 mg total) by mouth daily after supper. Garlon Hatchet, PA-C Taking Active             Home Care and Equipment/Supplies: Were Home Health Services Ordered?: NA Any new equipment or medical supplies ordered?: NA  Functional Questionnaire: Do you need assistance with bathing/showering or dressing?: No Do you need assistance with meal preparation?: No Do you need assistance with eating?: No Do you have difficulty maintaining continence: No Do you need assistance with getting out of bed/getting out of a chair/moving?: No Do you have difficulty managing or taking your medications?: No  Follow up appointments reviewed: PCP Follow-up appointment confirmed?: NA Specialist Hospital Follow-up appointment confirmed?: Yes Follow-Up Specialty Provider:: pt was advised to follow up with urology. Do you need transportation to your follow-up appointment?: No Do you understand care options if your condition(s) worsen?: Yes-patient verbalized understanding    SIGNATURE. Renelda Loma RMA

## 2022-08-26 ENCOUNTER — Encounter: Payer: Self-pay | Admitting: Nurse Practitioner

## 2022-08-26 ENCOUNTER — Ambulatory Visit (INDEPENDENT_AMBULATORY_CARE_PROVIDER_SITE_OTHER): Payer: Self-pay | Admitting: Nurse Practitioner

## 2022-08-26 VITALS — BP 120/78 | HR 64 | Temp 97.6°F | Ht 65.0 in | Wt 198.6 lb

## 2022-08-26 DIAGNOSIS — E876 Hypokalemia: Secondary | ICD-10-CM

## 2022-08-26 DIAGNOSIS — R7303 Prediabetes: Secondary | ICD-10-CM

## 2022-08-26 DIAGNOSIS — R300 Dysuria: Secondary | ICD-10-CM | POA: Insufficient documentation

## 2022-08-26 DIAGNOSIS — N200 Calculus of kidney: Secondary | ICD-10-CM | POA: Insufficient documentation

## 2022-08-26 LAB — POCT URINALYSIS DIPSTICK
Bilirubin, UA: NEGATIVE
Glucose, UA: NEGATIVE
Ketones, UA: NEGATIVE
Leukocytes, UA: NEGATIVE
Nitrite, UA: NEGATIVE
Protein, UA: NEGATIVE
Spec Grav, UA: 1.02 (ref 1.010–1.025)
Urobilinogen, UA: 0.2 E.U./dL
pH, UA: 5 (ref 5.0–8.0)

## 2022-08-26 NOTE — Progress Notes (Signed)
New Patient Office Visit  Subjective:  Patient ID: Victor Gonzalez, male    DOB: 08/28/1985  Age: 37 y.o. MRN: [id number]  CC:  Chief Complaint  Patient presents with   Follow-up    Hospital follow up.    HPI Victor Gonzalez is a 37 y.o. male  has a past medical history of Hypertension and Thyroid disease.   Patient presents for ER follow-up visit.  Patient was at the emergency department on 08/22/2022 due to complaints of abdominal pain, flank pain.  He was diagnosed with 1.4 mm right distal ureteral stone.  Patient currently denies fever, chills hematuria, urinary hesitancy, Nausea, vomiting. abdominal pain and flank pain has improved a lot.  Has some burning sensation sometimes when urinating.  Flomax was ordered but the patient stated that he did not take Flomax because he has been urinating without difficulty.  Takes pain medication as needed.     Interpretation services provided via [facility] health iPad.     Past Medical History:  Diagnosis Date   Hypertension    Thyroid disease     Past Surgical History:  Procedure Laterality Date   LAPAROSCOPIC APPENDECTOMY N/A 02/18/2020   Procedure: APPENDECTOMY LAPAROSCOPIC;  Surgeon: Diamantina Monks, MD;  Location: [facility] OR;  Service: General;  Laterality: N/A;   SHOULDER SURGERY      History reviewed. No pertinent family history.  Social History   Socioeconomic History   Marital status: Single    Spouse name: Not on file   Number of children: Not on file   Years of education: Not on file   Highest education level: Not on file  Occupational History   Not on file  Tobacco Use   Smoking status: Never   Smokeless tobacco: Never  Vaping Use   Vaping Use: Never used  Substance and Sexual Activity   Alcohol use: Yes    Comment: some days   Drug use: No   Sexual activity: Never  Other Topics Concern   Not on file  Social History Narrative   ** Merged History Encounter **       Social Determinants  of Health   Financial Resource Strain: Not on file  Food Insecurity: Not on file  Transportation Needs: Not on file  Physical Activity: Not on file  Stress: Not on file  Social Connections: Not on file  Intimate Partner Violence: Not on file    ROS Review of Systems  Constitutional:  Negative for activity change, appetite change, chills, diaphoresis, fatigue, fever and unexpected weight change.  HENT:  Negative for congestion, dental problem, drooling and ear discharge.   Eyes:  Negative for pain, discharge, redness and itching.  Respiratory:  Negative for apnea, cough, choking, chest tightness, shortness of breath and wheezing.   Cardiovascular: Negative.  Negative for chest pain, palpitations and leg swelling.  Gastrointestinal:  Positive for abdominal pain. Negative for abdominal distention, anal bleeding, blood in stool, constipation, diarrhea and vomiting.       Right flank pain  Endocrine: Negative for polydipsia, polyphagia and polyuria.  Genitourinary:  Negative for difficulty urinating, flank pain, frequency and genital sores.  Musculoskeletal: Negative.  Negative for arthralgias, back pain, gait problem and joint swelling.  Skin:  Negative for color change, pallor and rash.  Neurological:  Negative for dizziness, facial asymmetry, light-headedness, numbness and headaches.  Psychiatric/Behavioral:  Negative for agitation, behavioral problems, confusion, hallucinations, self-injury, sleep disturbance and suicidal ideas.     Objective:   Today's Vitals: BP 120/78  Pulse 64   Temp 97.6 F (36.4 C)   Ht 5\' 5"  (1.651 m)   Wt 198 lb 9.6 oz (90.1 kg)   SpO2 98%   BMI 33.05 kg/m   Physical Exam Vitals and nursing note reviewed.  Constitutional:      General: He is not in acute distress.    Appearance: Normal appearance. He is obese. He is not ill-appearing, toxic-appearing or diaphoretic.  HENT:     Mouth/Throat:     Mouth: Mucous membranes are moist.     Pharynx:  Oropharynx is clear. No oropharyngeal exudate or posterior oropharyngeal erythema.  Eyes:     General: No scleral icterus.       Right eye: No discharge.        Left eye: No discharge.     Extraocular Movements: Extraocular movements intact.     Conjunctiva/sclera: Conjunctivae normal.  Cardiovascular:     Rate and Rhythm: Normal rate and regular rhythm.     Pulses: Normal pulses.     Heart sounds: Normal heart sounds. No murmur heard.    No friction rub. No gallop.  Pulmonary:     Effort: Pulmonary effort is normal. No respiratory distress.     Breath sounds: Normal breath sounds. No stridor. No wheezing, rhonchi or rales.  Chest:     Chest wall: No tenderness.  Abdominal:     General: There is no distension.     Palpations: Abdomen is soft.     Tenderness: There is no abdominal tenderness. There is no right CVA tenderness, left CVA tenderness or guarding.     Comments: No tenderness on palpation  of the abdomen and flank area today  Musculoskeletal:        General: No swelling, tenderness, deformity or signs of injury.     Right lower leg: No edema.     Left lower leg: No edema.  Skin:    General: Skin is warm and dry.     Capillary Refill: Capillary refill takes less than 2 seconds.     Coloration: Skin is not jaundiced or pale.     Findings: No bruising, erythema or lesion.  Neurological:     Mental Status: He is alert and oriented to person, place, and time.     Motor: No weakness.     Coordination: Coordination normal.     Gait: Gait normal.  Psychiatric:        Mood and Affect: Mood normal.        Behavior: Behavior normal.        Thought Content: Thought content normal.        Judgment: Judgment normal.     Assessment & Plan:   Problem List Items Addressed This Visit       Genitourinary   Nephrolithiasis - Primary    Patient feels much better Has no urinary hesitancy but reports some dysuria Continue pain medication as needed Encouraged to take Flomax 0.4  mg daily as ordered Drink at least 64 ounces of water daily to maintain hydration UA was negative for UTI today Follow-up in 4 weeks if pain not resolved we will refer patient to urology        Other   Prediabetes    Check hemoglobin A1c      Relevant Orders   Hemoglobin A1c   Dysuria    UA negative for UTI Patient encouraged to drink at least 64 ounces of water daily      Relevant Orders  Urinalysis Dipstick (Completed)   Hypokalemia    Lab Results  Component Value Date   NA 138 08/22/2022   K 3.2 (L) 08/22/2022   CO2 24 08/22/2022   BUN 14 08/22/2022   CREATININE 0.82 08/22/2022   CALCIUM 8.7 (L) 08/22/2022   GLUCOSE 110 (H) 08/22/2022         Relevant Orders   Potassium    Outpatient Encounter Medications as of 08/26/2022  Medication Sig   HYDROcodone-acetaminophen (NORCO/VICODIN) 5-325 MG tablet Take 1 tablet by mouth every 6 (six) hours as needed.   ibuprofen (ADVIL) 400 MG tablet Take 400 mg by mouth every 6 (six) hours as needed.   acetaminophen (TYLENOL) 500 MG tablet Take 500-1,000 mg by mouth as needed (pain). (Patient not taking: Reported on 07/05/2020)   albuterol (VENTOLIN HFA) 108 (90 Base) MCG/ACT inhaler Inhale 2 puffs into the lungs every 6 (six) hours as needed for wheezing or shortness of breath. (Patient not taking: Reported on 01/06/2020)   ondansetron (ZOFRAN-ODT) 4 MG disintegrating tablet Take 1 tablet (4 mg total) by mouth every 8 (eight) hours as needed for nausea or vomiting. (Patient not taking: Reported on 08/26/2022)   oxyCODONE (ROXICODONE) 5 MG immediate release tablet Take 1 tablet (5 mg total) by mouth every 4 (four) hours as needed for severe pain. (Patient not taking: Reported on 08/26/2022)   tamsulosin (FLOMAX) 0.4 MG CAPS capsule Take 1 capsule (0.4 mg total) by mouth daily after supper. (Patient not taking: Reported on 08/26/2022)   No facility-administered encounter medications on file as of 08/26/2022.    Follow-up: Return in about 4  weeks (around 09/23/2022) for Kidney stones.   Donell Beers, FNP

## 2022-08-26 NOTE — Assessment & Plan Note (Signed)
Check hemoglobin A1c.

## 2022-08-26 NOTE — Assessment & Plan Note (Signed)
Patient feels much better Has no urinary hesitancy but reports some dysuria Continue pain medication as needed Encouraged to take Flomax 0.4 mg daily as ordered Drink at least 64 ounces of water daily to maintain hydration UA was negative for UTI today Follow-up in 4 weeks if pain not resolved we will refer patient to urology

## 2022-08-26 NOTE — Assessment & Plan Note (Signed)
UA negative for UTI Patient encouraged to drink at least 64 ounces of water daily

## 2022-08-26 NOTE — Patient Instructions (Signed)

## 2022-08-26 NOTE — Assessment & Plan Note (Signed)
Lab Results  Component Value Date   NA 138 08/22/2022   K 3.2 (L) 08/22/2022   CO2 24 08/22/2022   BUN 14 08/22/2022   CREATININE 0.82 08/22/2022   CALCIUM 8.7 (L) 08/22/2022   GLUCOSE 110 (H) 08/22/2022

## 2022-08-27 LAB — POTASSIUM: Potassium: 4.5 mmol/L (ref 3.5–5.2)

## 2022-08-27 LAB — HEMOGLOBIN A1C
Est. average glucose Bld gHb Est-mCnc: 108 mg/dL
Hgb A1c MFr Bld: 5.4 % (ref 4.8–5.6)

## 2022-09-23 ENCOUNTER — Encounter: Payer: Self-pay | Admitting: Nurse Practitioner

## 2022-09-23 ENCOUNTER — Ambulatory Visit (INDEPENDENT_AMBULATORY_CARE_PROVIDER_SITE_OTHER): Payer: Self-pay | Admitting: Nurse Practitioner

## 2022-09-23 VITALS — BP 132/82 | HR 46 | Temp 97.2°F | Wt 203.0 lb

## 2022-09-23 DIAGNOSIS — R300 Dysuria: Secondary | ICD-10-CM

## 2022-09-23 DIAGNOSIS — N200 Calculus of kidney: Secondary | ICD-10-CM

## 2022-09-23 LAB — POCT URINALYSIS DIP (PROADVANTAGE DEVICE)
Bilirubin, UA: NEGATIVE
Glucose, UA: NEGATIVE mg/dL
Ketones, POC UA: NEGATIVE mg/dL
Leukocytes, UA: NEGATIVE
Nitrite, UA: NEGATIVE
Protein Ur, POC: NEGATIVE mg/dL
Specific Gravity, Urine: 1.01
Urobilinogen, Ur: 0.2
pH, UA: 5.5 (ref 5.0–8.0)

## 2022-09-23 MED ORDER — PHENAZOPYRIDINE HCL 200 MG PO TABS
200.0000 mg | ORAL_TABLET | Freq: Three times a day (TID) | ORAL | 0 refills | Status: AC
Start: 2022-09-23 — End: ?

## 2022-09-23 NOTE — Assessment & Plan Note (Addendum)
UA negative for UTI Will screen for STI  - POCT Urinalysis DIP (Proadvantage Device) - Chlamydia/Gonococcus/Trichomonas, NAA - Ambulatory referral to Urology - phenazopyridine (PYRIDIUM) 200 MG tablet; Take 1 tablet (200 mg total) by mouth 3 (three) times daily.  For symptomatic relief of dysuria. dispense: 6 tablet; Refill: 0

## 2022-09-23 NOTE — Progress Notes (Signed)
Established Patient Office Visit  Subjective:  Patient ID: Victor Gonzalez, male    DOB: 04-Jul-1985  Age: 37 y.o. MRN: [id number]  CC:  Chief Complaint  Patient presents with   Follow-up    Follow up U/A still having  burning while urinating    HPI Victor Gonzalez is a 37 y.o. male  has a past medical history of Hypertension and Thyroid disease.  Patient presents for follow-up for nephrolithiasis.  He was seen at this office about 4 weeks ago after presenting to the emergency room for nephrolithiasis.  Today patient complains of dysuria, abdominal pain.  He denies fever, chills nausea, vomiting, bloody urine, penile rashes, penile discharge.  He denies passing stone.  He would like medication for symptomatic relief of dysuria while waiting for labs results. .  Interpretations services provided via [place] [employer]     Past Medical History:  Diagnosis Date   Hypertension    Thyroid disease     Past Surgical History:  Procedure Laterality Date   LAPAROSCOPIC APPENDECTOMY N/A 02/18/2020   Procedure: APPENDECTOMY LAPAROSCOPIC;  Surgeon: Diamantina Monks, MD;  Location: [facility] OR;  Service: General;  Laterality: N/A;   SHOULDER SURGERY      No family history on file.  Social History   Socioeconomic History   Marital status: Single    Spouse name: Not on file   Number of children: Not on file   Years of education: Not on file   Highest education level: Not on file  Occupational History   Not on file  Tobacco Use   Smoking status: Never   Smokeless tobacco: Never  Vaping Use   Vaping status: Never Used  Substance and Sexual Activity   Alcohol use: Yes    Comment: some days   Drug use: No   Sexual activity: Never  Other Topics Concern   Not on file  Social History Narrative   ** Merged History Encounter **       Social Determinants of Health   Financial Resource Strain: Not on file  Food Insecurity: Not on file  Transportation Needs: Not on  file  Physical Activity: Not on file  Stress: Not on file  Social Connections: Not on file  Intimate Partner Violence: Not on file    Outpatient Medications Prior to Visit  Medication Sig Dispense Refill   ibuprofen (ADVIL) 400 MG tablet Take 400 mg by mouth every 6 (six) hours as needed.     tamsulosin (FLOMAX) 0.4 MG CAPS capsule Take 1 capsule (0.4 mg total) by mouth daily after supper. 10 capsule 0   acetaminophen (TYLENOL) 500 MG tablet Take 500-1,000 mg by mouth as needed (pain). (Patient not taking: Reported on 07/05/2020)     albuterol (VENTOLIN HFA) 108 (90 Base) MCG/ACT inhaler Inhale 2 puffs into the lungs every 6 (six) hours as needed for wheezing or shortness of breath. (Patient not taking: Reported on 01/06/2020) 6.7 g 0   HYDROcodone-acetaminophen (NORCO/VICODIN) 5-325 MG tablet Take 1 tablet by mouth every 6 (six) hours as needed. (Patient not taking: Reported on 09/23/2022) 10 tablet 0   ondansetron (ZOFRAN-ODT) 4 MG disintegrating tablet Take 1 tablet (4 mg total) by mouth every 8 (eight) hours as needed for nausea or vomiting. (Patient not taking: Reported on 08/26/2022) 10 tablet 0   oxyCODONE (ROXICODONE) 5 MG immediate release tablet Take 1 tablet (5 mg total) by mouth every 4 (four) hours as needed for severe pain. (Patient not taking: Reported on 08/26/2022)  30 tablet 0   No facility-administered medications prior to visit.    No Known Allergies  ROS Review of Systems  Constitutional:  Negative for activity change, appetite change, chills, diaphoresis, fatigue, fever and unexpected weight change.  HENT:  Negative for congestion, dental problem, drooling and ear discharge.   Eyes:  Negative for pain, discharge, redness and itching.  Respiratory:  Negative for apnea, cough, choking, chest tightness, shortness of breath and wheezing.   Cardiovascular: Negative.  Negative for chest pain, palpitations and leg swelling.  Gastrointestinal:  Positive for abdominal pain.  Negative for abdominal distention, anal bleeding, blood in stool, constipation, diarrhea and vomiting.  Endocrine: Negative for polydipsia, polyphagia and polyuria.  Genitourinary:  Positive for dysuria. Negative for difficulty urinating, flank pain, frequency and genital sores.  Musculoskeletal: Negative.  Negative for arthralgias, back pain, gait problem and joint swelling.  Skin:  Negative for color change, pallor and rash.  Neurological:  Negative for dizziness, facial asymmetry, light-headedness, numbness and headaches.  Psychiatric/Behavioral:  Negative for agitation, behavioral problems, confusion, hallucinations, self-injury, sleep disturbance and suicidal ideas.       Objective:    Physical Exam Vitals and nursing note reviewed.  Constitutional:      General: He is not in acute distress.    Appearance: Normal appearance. He is not ill-appearing, toxic-appearing or diaphoretic.  HENT:     Mouth/Throat:     Mouth: Mucous membranes are moist.     Pharynx: Oropharynx is clear. No oropharyngeal exudate or posterior oropharyngeal erythema.  Eyes:     General: No scleral icterus.       Right eye: No discharge.        Left eye: No discharge.     Extraocular Movements: Extraocular movements intact.     Conjunctiva/sclera: Conjunctivae normal.  Cardiovascular:     Rate and Rhythm: Normal rate and regular rhythm.     Pulses: Normal pulses.     Heart sounds: Normal heart sounds. No murmur heard.    No friction rub. No gallop.  Pulmonary:     Effort: Pulmonary effort is normal. No respiratory distress.     Breath sounds: Normal breath sounds. No stridor. No wheezing, rhonchi or rales.  Chest:     Chest wall: No tenderness.  Abdominal:     General: There is no distension.     Palpations: Abdomen is soft.     Tenderness: There is no abdominal tenderness. There is no right CVA tenderness, left CVA tenderness or guarding.  Musculoskeletal:        General: No swelling, tenderness,  deformity or signs of injury.     Right lower leg: No edema.     Left lower leg: No edema.  Skin:    General: Skin is warm and dry.     Capillary Refill: Capillary refill takes less than 2 seconds.     Coloration: Skin is not jaundiced or pale.     Findings: No bruising, erythema or lesion.  Neurological:     Mental Status: He is alert and oriented to person, place, and time.     Motor: No weakness.     Coordination: Coordination normal.     Gait: Gait normal.  Psychiatric:        Mood and Affect: Mood normal.        Behavior: Behavior normal.        Thought Content: Thought content normal.        Judgment: Judgment normal.  BP 132/82   Pulse (!) 46   Temp (!) 97.2 F (36.2 C)   Wt 203 lb (92.1 kg)   SpO2 98%   BMI 33.78 kg/m  Wt Readings from Last 3 Encounters:  09/23/22 203 lb (92.1 kg)  08/26/22 198 lb 9.6 oz (90.1 kg)  08/22/22 198 lb (89.8 kg)    No results found for: "TSH" Lab Results  Component Value Date   WBC 5.7 08/22/2022   HGB 14.6 08/22/2022   HCT 41.4 08/22/2022   MCV 88.7 08/22/2022   PLT 234 08/22/2022   Lab Results  Component Value Date   NA 138 08/22/2022   K 4.5 08/26/2022   CO2 24 08/22/2022   GLUCOSE 110 (H) 08/22/2022   BUN 14 08/22/2022   CREATININE 0.82 08/22/2022   BILITOT 1.3 (H) 08/22/2022   ALKPHOS 97 08/22/2022   AST 27 08/22/2022   ALT 39 08/22/2022   PROT 6.7 08/22/2022   ALBUMIN 4.0 08/22/2022   CALCIUM 8.7 (L) 08/22/2022   ANIONGAP 9 08/22/2022   No results found for: "CHOL" No results found for: "HDL" No results found for: "LDLCALC" No results found for: "TRIG" No results found for: "CHOLHDL" Lab Results  Component Value Date   HGBA1C 5.4 08/26/2022      Assessment & Plan:   Problem List Items Addressed This Visit       Genitourinary   Nephrolithiasis    Patient encouraged to drink up to 64 ounces of water daily UA negative for UTI, trace blood noted in the urine He was referred to urology today        Relevant Orders   Ambulatory referral to Urology     Other   Dysuria - Primary    UA negative for UTI Will screen for STI  - POCT Urinalysis DIP (Proadvantage Device) - Chlamydia/Gonococcus/Trichomonas, NAA - Ambulatory referral to Urology - phenazopyridine (PYRIDIUM) 200 MG tablet; Take 1 tablet (200 mg total) by mouth 3 (three) times daily.  For symptomatic relief of dysuria. dispense: 6 tablet; Refill: 0         Relevant Medications   phenazopyridine (PYRIDIUM) 200 MG tablet   Other Relevant Orders   POCT Urinalysis DIP (Proadvantage Device) (Completed)   Chlamydia/Gonococcus/Trichomonas, NAA   Ambulatory referral to Urology    Meds ordered this encounter  Medications   phenazopyridine (PYRIDIUM) 200 MG tablet    Sig: Take 1 tablet (200 mg total) by mouth 3 (three) times daily.    Dispense:  6 tablet    Refill:  0    Follow-up: Return in about 3 months (around 12/24/2022).    Donell Beers, FNP

## 2022-09-23 NOTE — Assessment & Plan Note (Addendum)
Patient encouraged to drink up to 64 ounces of water daily UA negative for UTI, trace blood noted in the urine He was referred to urology today

## 2022-09-23 NOTE — Patient Instructions (Signed)

## 2022-09-27 ENCOUNTER — Other Ambulatory Visit: Payer: Self-pay | Admitting: Nurse Practitioner

## 2022-09-27 DIAGNOSIS — N200 Calculus of kidney: Secondary | ICD-10-CM

## 2022-09-27 MED ORDER — TAMSULOSIN HCL 0.4 MG PO CAPS
0.4000 mg | ORAL_CAPSULE | Freq: Every day | ORAL | 0 refills | Status: AC
Start: 2022-09-27 — End: ?

## 2022-12-24 ENCOUNTER — Ambulatory Visit: Payer: Self-pay | Admitting: Nurse Practitioner

## 2023-01-01 ENCOUNTER — Ambulatory Visit: Payer: Self-pay | Admitting: Nurse Practitioner
